# Patient Record
Sex: Female | Born: 1965
Health system: Southern US, Community
[De-identification: ages and names within clinical notes are randomized; demographics above are authoritative.]

## PROBLEM LIST (undated history)

## (undated) DIAGNOSIS — I10 Essential (primary) hypertension: Secondary | ICD-10-CM

## (undated) DIAGNOSIS — J45909 Unspecified asthma, uncomplicated: Secondary | ICD-10-CM

## (undated) DIAGNOSIS — M1990 Unspecified osteoarthritis, unspecified site: Secondary | ICD-10-CM

## (undated) DIAGNOSIS — E119 Type 2 diabetes mellitus without complications: Secondary | ICD-10-CM

## (undated) DIAGNOSIS — G629 Polyneuropathy, unspecified: Secondary | ICD-10-CM

## (undated) HISTORY — PX: CARPAL TUNNEL RELEASE: SHX101

## (undated) HISTORY — PX: REPLACEMENT TOTAL KNEE: SUR1224

## (undated) HISTORY — PX: EYE SURGERY: SHX253

## (undated) HISTORY — PX: JOINT REPLACEMENT: SHX530

---

## 2008-12-24 ENCOUNTER — Encounter: Payer: Self-pay | Admitting: Infectious Diseases

## 2009-01-14 ENCOUNTER — Ambulatory Visit: Payer: Self-pay | Admitting: Infectious Diseases

## 2009-01-14 DIAGNOSIS — M1990 Unspecified osteoarthritis, unspecified site: Secondary | ICD-10-CM | POA: Insufficient documentation

## 2009-01-14 DIAGNOSIS — G5600 Carpal tunnel syndrome, unspecified upper limb: Secondary | ICD-10-CM | POA: Insufficient documentation

## 2009-01-14 DIAGNOSIS — A539 Syphilis, unspecified: Secondary | ICD-10-CM | POA: Insufficient documentation

## 2009-01-14 DIAGNOSIS — E669 Obesity, unspecified: Secondary | ICD-10-CM | POA: Insufficient documentation

## 2009-01-14 DIAGNOSIS — I10 Essential (primary) hypertension: Secondary | ICD-10-CM | POA: Insufficient documentation

## 2009-08-20 ENCOUNTER — Encounter: Admission: RE | Admit: 2009-08-20 | Discharge: 2009-09-12 | Payer: Self-pay | Admitting: Internal Medicine

## 2010-03-25 ENCOUNTER — Ambulatory Visit: Payer: Self-pay | Admitting: Diagnostic Radiology

## 2010-03-25 ENCOUNTER — Ambulatory Visit (HOSPITAL_BASED_OUTPATIENT_CLINIC_OR_DEPARTMENT_OTHER): Admission: RE | Admit: 2010-03-25 | Discharge: 2010-03-25 | Payer: Self-pay | Admitting: Internal Medicine

## 2010-04-15 ENCOUNTER — Encounter
Admission: RE | Admit: 2010-04-15 | Discharge: 2010-06-10 | Payer: Self-pay | Source: Home / Self Care | Admitting: Sports Medicine

## 2010-06-19 ENCOUNTER — Ambulatory Visit (HOSPITAL_COMMUNITY): Admission: RE | Admit: 2010-06-19 | Discharge: 2010-06-20 | Payer: Self-pay | Admitting: Orthopedic Surgery

## 2010-07-21 ENCOUNTER — Encounter
Admission: RE | Admit: 2010-07-21 | Discharge: 2010-07-29 | Payer: Self-pay | Source: Home / Self Care | Attending: Orthopedic Surgery | Admitting: Orthopedic Surgery

## 2010-08-03 ENCOUNTER — Encounter: Admission: RE | Admit: 2010-08-03 | Payer: Self-pay | Source: Home / Self Care | Admitting: Orthopedic Surgery

## 2010-08-05 ENCOUNTER — Encounter
Admission: RE | Admit: 2010-08-05 | Discharge: 2010-08-05 | Payer: Self-pay | Source: Home / Self Care | Attending: Physical Medicine and Rehabilitation | Admitting: Physical Medicine and Rehabilitation

## 2010-08-12 ENCOUNTER — Encounter: Admit: 2010-08-12 | Payer: Self-pay | Admitting: Orthopedic Surgery

## 2010-10-13 LAB — DIFFERENTIAL
Basophils Absolute: 0 10*3/uL (ref 0.0–0.1)
Basophils Relative: 0 % (ref 0–1)
Monocytes Relative: 5 % (ref 3–12)
Neutro Abs: 5.3 10*3/uL (ref 1.7–7.7)
Neutrophils Relative %: 66 % (ref 43–77)

## 2010-10-13 LAB — PROTIME-INR
INR: 0.9 (ref 0.00–1.49)
Prothrombin Time: 12.4 seconds (ref 11.6–15.2)

## 2010-10-13 LAB — APTT: aPTT: 28 seconds (ref 24–37)

## 2010-10-13 LAB — BASIC METABOLIC PANEL
BUN: 12 mg/dL (ref 6–23)
CO2: 33 mEq/L — ABNORMAL HIGH (ref 19–32)
Calcium: 9.1 mg/dL (ref 8.4–10.5)
Chloride: 99 mEq/L (ref 96–112)
Creatinine, Ser: 0.83 mg/dL (ref 0.4–1.2)
GFR calc Af Amer: >60 mL/min (ref >60)
GFR calc non Af Amer: >60 mL/min (ref >60)
Glucose, Bld: 132 mg/dL — ABNORMAL HIGH (ref 70–99)
Sodium: 138 mEq/L (ref 135–145)

## 2010-10-13 LAB — URINALYSIS, ROUTINE W REFLEX MICROSCOPIC
Bilirubin Urine: NEGATIVE
Glucose, UA: NEGATIVE mg/dL
Hgb urine dipstick: NEGATIVE
Ketones, ur: NEGATIVE mg/dL
Protein, ur: NEGATIVE mg/dL

## 2010-10-13 LAB — CBC
Hemoglobin: 11.5 g/dL — ABNORMAL LOW (ref 12.0–15.0)
MCHC: 32.4 g/dL (ref 30.0–36.0)
Platelets: 177 10*3/uL (ref 150–400)

## 2011-03-03 ENCOUNTER — Ambulatory Visit: Payer: Medicaid Other | Attending: Orthopedic Surgery | Admitting: Physical Therapy

## 2011-03-11 ENCOUNTER — Ambulatory Visit: Payer: Medicaid Other | Admitting: Physical Therapy

## 2011-04-28 ENCOUNTER — Ambulatory Visit: Payer: Medicaid Other | Admitting: Physical Therapy

## 2011-04-29 ENCOUNTER — Ambulatory Visit: Payer: Medicaid Other | Admitting: Physical Therapy

## 2011-05-12 ENCOUNTER — Ambulatory Visit: Payer: Medicaid Other | Attending: Orthopedic Surgery | Admitting: Physical Therapy

## 2011-05-12 DIAGNOSIS — M25669 Stiffness of unspecified knee, not elsewhere classified: Secondary | ICD-10-CM | POA: Insufficient documentation

## 2011-05-12 DIAGNOSIS — M25569 Pain in unspecified knee: Secondary | ICD-10-CM | POA: Insufficient documentation

## 2011-05-12 DIAGNOSIS — IMO0001 Reserved for inherently not codable concepts without codable children: Secondary | ICD-10-CM | POA: Insufficient documentation

## 2011-05-19 ENCOUNTER — Ambulatory Visit: Payer: Medicaid Other | Admitting: Physical Therapy

## 2011-05-24 ENCOUNTER — Ambulatory Visit: Payer: Medicaid Other | Admitting: Physical Therapy

## 2011-05-26 ENCOUNTER — Encounter: Payer: Medicaid Other | Admitting: Physical Therapy

## 2011-06-02 ENCOUNTER — Ambulatory Visit: Payer: Medicaid Other | Admitting: Physical Therapy

## 2011-06-08 ENCOUNTER — Encounter: Payer: Medicaid Other | Admitting: Physical Therapy

## 2011-06-17 ENCOUNTER — Ambulatory Visit: Payer: Medicaid Other | Attending: Orthopedic Surgery | Admitting: Physical Therapy

## 2011-06-17 DIAGNOSIS — M25669 Stiffness of unspecified knee, not elsewhere classified: Secondary | ICD-10-CM | POA: Insufficient documentation

## 2011-06-17 DIAGNOSIS — M25569 Pain in unspecified knee: Secondary | ICD-10-CM | POA: Insufficient documentation

## 2011-06-17 DIAGNOSIS — IMO0001 Reserved for inherently not codable concepts without codable children: Secondary | ICD-10-CM | POA: Insufficient documentation

## 2011-08-13 DIAGNOSIS — T85398A Other mechanical complication of other ocular prosthetic devices, implants and grafts, initial encounter: Secondary | ICD-10-CM | POA: Insufficient documentation

## 2011-09-06 ENCOUNTER — Ambulatory Visit: Payer: Medicare Other | Attending: Anesthesiology | Admitting: Physical Therapy

## 2011-09-06 DIAGNOSIS — M545 Low back pain, unspecified: Secondary | ICD-10-CM | POA: Insufficient documentation

## 2011-09-06 DIAGNOSIS — M25569 Pain in unspecified knee: Secondary | ICD-10-CM | POA: Insufficient documentation

## 2011-09-06 DIAGNOSIS — IMO0001 Reserved for inherently not codable concepts without codable children: Secondary | ICD-10-CM | POA: Insufficient documentation

## 2013-01-06 DIAGNOSIS — F4310 Post-traumatic stress disorder, unspecified: Secondary | ICD-10-CM | POA: Insufficient documentation

## 2013-01-06 DIAGNOSIS — F339 Major depressive disorder, recurrent, unspecified: Secondary | ICD-10-CM | POA: Insufficient documentation

## 2013-01-06 DIAGNOSIS — E7800 Pure hypercholesterolemia, unspecified: Secondary | ICD-10-CM | POA: Insufficient documentation

## 2014-04-03 DIAGNOSIS — G4733 Obstructive sleep apnea (adult) (pediatric): Secondary | ICD-10-CM | POA: Insufficient documentation

## 2014-04-03 DIAGNOSIS — E1149 Type 2 diabetes mellitus with other diabetic neurological complication: Secondary | ICD-10-CM | POA: Insufficient documentation

## 2014-04-03 DIAGNOSIS — M5416 Radiculopathy, lumbar region: Secondary | ICD-10-CM | POA: Insufficient documentation

## 2014-04-03 DIAGNOSIS — K219 Gastro-esophageal reflux disease without esophagitis: Secondary | ICD-10-CM | POA: Insufficient documentation

## 2014-04-03 DIAGNOSIS — J309 Allergic rhinitis, unspecified: Secondary | ICD-10-CM | POA: Insufficient documentation

## 2014-04-03 DIAGNOSIS — E1142 Type 2 diabetes mellitus with diabetic polyneuropathy: Secondary | ICD-10-CM | POA: Insufficient documentation

## 2014-04-03 DIAGNOSIS — J4520 Mild intermittent asthma, uncomplicated: Secondary | ICD-10-CM | POA: Insufficient documentation

## 2014-04-03 DIAGNOSIS — F1421 Cocaine dependence, in remission: Secondary | ICD-10-CM | POA: Insufficient documentation

## 2014-04-03 DIAGNOSIS — F419 Anxiety disorder, unspecified: Secondary | ICD-10-CM | POA: Insufficient documentation

## 2014-04-03 DIAGNOSIS — G894 Chronic pain syndrome: Secondary | ICD-10-CM | POA: Insufficient documentation

## 2014-04-03 DIAGNOSIS — G43909 Migraine, unspecified, not intractable, without status migrainosus: Secondary | ICD-10-CM | POA: Insufficient documentation

## 2014-04-03 DIAGNOSIS — N3946 Mixed incontinence: Secondary | ICD-10-CM | POA: Insufficient documentation

## 2014-05-30 DIAGNOSIS — F329 Major depressive disorder, single episode, unspecified: Secondary | ICD-10-CM | POA: Insufficient documentation

## 2014-05-30 DIAGNOSIS — F32A Depression, unspecified: Secondary | ICD-10-CM | POA: Insufficient documentation

## 2015-08-26 DIAGNOSIS — F5101 Primary insomnia: Secondary | ICD-10-CM | POA: Insufficient documentation

## 2016-01-30 DIAGNOSIS — E0590 Thyrotoxicosis, unspecified without thyrotoxic crisis or storm: Secondary | ICD-10-CM | POA: Insufficient documentation

## 2017-01-14 DIAGNOSIS — R911 Solitary pulmonary nodule: Secondary | ICD-10-CM | POA: Insufficient documentation

## 2017-02-28 DIAGNOSIS — Z96651 Presence of right artificial knee joint: Secondary | ICD-10-CM | POA: Insufficient documentation

## 2017-11-21 ENCOUNTER — Emergency Department (HOSPITAL_BASED_OUTPATIENT_CLINIC_OR_DEPARTMENT_OTHER): Payer: Medicare Other

## 2017-11-21 ENCOUNTER — Other Ambulatory Visit: Payer: Self-pay

## 2017-11-21 ENCOUNTER — Encounter (HOSPITAL_BASED_OUTPATIENT_CLINIC_OR_DEPARTMENT_OTHER): Payer: Self-pay | Admitting: Emergency Medicine

## 2017-11-21 ENCOUNTER — Emergency Department (HOSPITAL_BASED_OUTPATIENT_CLINIC_OR_DEPARTMENT_OTHER)
Admission: EM | Admit: 2017-11-21 | Discharge: 2017-11-21 | Disposition: A | Discharge status: Home / Self Care | Payer: Medicare Other | Attending: Emergency Medicine | Admitting: Emergency Medicine

## 2017-11-21 DIAGNOSIS — Z7984 Long term (current) use of oral hypoglycemic drugs: Secondary | ICD-10-CM | POA: Diagnosis not present

## 2017-11-21 DIAGNOSIS — Z7982 Long term (current) use of aspirin: Secondary | ICD-10-CM | POA: Diagnosis not present

## 2017-11-21 DIAGNOSIS — Y939 Activity, unspecified: Secondary | ICD-10-CM | POA: Diagnosis not present

## 2017-11-21 DIAGNOSIS — Y929 Unspecified place or not applicable: Secondary | ICD-10-CM | POA: Diagnosis not present

## 2017-11-21 DIAGNOSIS — Z96653 Presence of artificial knee joint, bilateral: Secondary | ICD-10-CM | POA: Diagnosis not present

## 2017-11-21 DIAGNOSIS — Y999 Unspecified external cause status: Secondary | ICD-10-CM | POA: Diagnosis not present

## 2017-11-21 DIAGNOSIS — S46911A Strain of unspecified muscle, fascia and tendon at shoulder and upper arm level, right arm, initial encounter: Secondary | ICD-10-CM | POA: Diagnosis not present

## 2017-11-21 DIAGNOSIS — J45909 Unspecified asthma, uncomplicated: Secondary | ICD-10-CM | POA: Insufficient documentation

## 2017-11-21 DIAGNOSIS — E119 Type 2 diabetes mellitus without complications: Secondary | ICD-10-CM | POA: Diagnosis not present

## 2017-11-21 DIAGNOSIS — S4991XA Unspecified injury of right shoulder and upper arm, initial encounter: Secondary | ICD-10-CM | POA: Diagnosis present

## 2017-11-21 DIAGNOSIS — R0789 Other chest pain: Secondary | ICD-10-CM | POA: Insufficient documentation

## 2017-11-21 DIAGNOSIS — I10 Essential (primary) hypertension: Secondary | ICD-10-CM | POA: Diagnosis not present

## 2017-11-21 DIAGNOSIS — X58XXXA Exposure to other specified factors, initial encounter: Secondary | ICD-10-CM | POA: Insufficient documentation

## 2017-11-21 HISTORY — DX: Essential (primary) hypertension: I10

## 2017-11-21 HISTORY — DX: Unspecified asthma, uncomplicated: J45.909

## 2017-11-21 HISTORY — DX: Unspecified osteoarthritis, unspecified site: M19.90

## 2017-11-21 HISTORY — DX: Type 2 diabetes mellitus without complications: E11.9

## 2017-11-21 LAB — TROPONIN I

## 2017-11-21 LAB — CBC
HCT: 36.4 % (ref 36.0–46.0)
Hemoglobin: 12.4 g/dL (ref 12.0–15.0)
MCH: 25.1 pg — AB (ref 26.0–34.0)
MCHC: 34.1 g/dL (ref 30.0–36.0)
MCV: 73.5 fL — AB (ref 78.0–100.0)
PLATELETS: 197 10*3/uL (ref 150–400)
RBC: 4.95 MIL/uL (ref 3.87–5.11)
RDW: 15.6 % — AB (ref 11.5–15.5)
WBC: 5.3 10*3/uL (ref 4.0–10.5)

## 2017-11-21 LAB — BASIC METABOLIC PANEL
Anion gap: 8 (ref 5–15)
BUN: 9 mg/dL (ref 6–20)
CALCIUM: 9.1 mg/dL (ref 8.9–10.3)
CHLORIDE: 105 mmol/L (ref 101–111)
CO2: 23 mmol/L (ref 22–32)
CREATININE: 0.56 mg/dL (ref 0.44–1.00)
GFR calc non Af Amer: >60 mL/min (ref >60)
Glucose, Bld: 89 mg/dL (ref 65–99)
Potassium: 3.3 mmol/L — ABNORMAL LOW (ref 3.5–5.1)
SODIUM: 136 mmol/L (ref 135–145)

## 2017-11-21 MED ORDER — CYCLOBENZAPRINE HCL 10 MG PO TABS: 10.0000 mg | ORAL_TABLET | Freq: Three times a day (TID) | ORAL | 0 refills | Status: DC | PRN

## 2017-11-21 MED ORDER — NAPROXEN 500 MG PO TABS: 500.0000 mg | ORAL_TABLET | Freq: Two times a day (BID) | ORAL | 0 refills | Status: DC

## 2017-11-21 MED ORDER — POTASSIUM CHLORIDE CRYS ER 20 MEQ PO TBCR
40.0000 meq | EXTENDED_RELEASE_TABLET | Freq: Once | ORAL | Status: AC
  Administered 2017-11-21: 40 meq via ORAL
  Filled 2017-11-21: qty 2

## 2017-11-21 MED ORDER — KETOROLAC TROMETHAMINE 15 MG/ML IJ SOLN
15.0000 mg | Freq: Once | INTRAMUSCULAR | Status: AC
  Administered 2017-11-21: 15 mg via INTRAVENOUS
  Filled 2017-11-21: qty 1

## 2017-11-21 NOTE — ED Provider Notes (Signed)
[facility] EMERGENCY DEPARTMENT Provider Note   CSN: [id number] Arrival date & time: 11/21/17  1630     History   Chief Complaint Chief Complaint  Patient presents with  . Chest Pain  . Shoulder Pain    HPI Jeanne Shepard is a 52 y.o. female.  HPI  52 year old female presents with right shoulder pain and right-sided chest pain.  Shoulder pain has been for about 2 weeks.  No trauma.  Hurts worse in her shoulder and shoots down her right arm as well as into her right neck.  It is dull, achy, and burning.  It is constant.  Positional changes of her neck do not cause increased pain.  There is no weakness.  She has not taken anything for the pain.  Over the last 2 or 3 days has been having intermittent right-sided chest pain in her mid axillary line.  No trauma here either.  His pain is more sharp.  It is not pleuritic or worse with exertion.  There is no shortness of breath or diaphoresis.  Past Medical History:  Diagnosis Date  . Arthritis   . Asthma   . Diabetes mellitus without complication (HCC)   . Hypertension     Patient Active Problem List   Diagnosis Date Noted  . SYPHILIS 01/14/2009  . OBESITY 01/14/2009  . CARPAL TUNNEL SYNDROME 01/14/2009  . HYPERTENSION 01/14/2009  . DEGENERATIVE JOINT DISEASE 01/14/2009    Past Surgical History:  Procedure Laterality Date  . CARPAL TUNNEL RELEASE Bilateral   . EYE SURGERY    . REPLACEMENT TOTAL KNEE Bilateral      OB History   None      Home Medications    Prior to Admission medications   Medication Sig Start Date End Date Taking? Authorizing Provider  albuterol (PROAIR HFA) 108 (90 Base) MCG/ACT inhaler Inhale into the lungs.   Yes [provider]  amLODipine (NORVASC) 10 MG tablet TK 1 T PO ONCE DAILY 09/21/17  Yes [provider]  aspirin EC 81 MG tablet Take by mouth. 07/01/17  Yes [provider]  diclofenac (VOLTAREN) 75 MG EC tablet  10/20/17  Yes [provider]   Estradiol 10 MCG TABS vaginal tablet INSERT 1 TABLET INTRAVAGINALLY QHS FOR 2 WEEKS THEN TWICE WEEKLY 11/16/17  Yes [provider]  fluticasone furoate-vilanterol (BREO ELLIPTA) 100-25 MCG/INH AEPB INHALE 1 PUFF INTO THE LUNGS DAILY 12/15/16  Yes [provider]  gabapentin (NEURONTIN) 600 MG tablet  11/17/17  Yes [provider]  lisinopril (PRINIVIL,ZESTRIL) 5 MG tablet TK 1 T PO ONCE DAILY 08/18/17  Yes [provider]  metFORMIN (GLUCOPHAGE-XR) 500 MG 24 hr tablet TK 2 TS PO ATN 10/31/17  Yes [provider]  methimazole (TAPAZOLE) 5 MG tablet  08/27/17  Yes [provider]  omeprazole (PRILOSEC) 20 MG capsule TK 1 C PO BID 08/29/17  Yes [provider]  pravastatin (PRAVACHOL) 40 MG tablet  10/14/17  Yes [provider]  Vitamin D, Ergocalciferol, (DRISDOL) 50000 units CAPS capsule Take by mouth. 03/31/17  Yes [provider]  zolpidem (AMBIEN) 10 MG tablet TK 1 T PO Q NIGHT PRF SLP 10/26/17  Yes [provider]  cyclobenzaprine (FLEXERIL) 10 MG tablet Take 1 tablet (10 mg total) by mouth 3 (three) times daily as needed for muscle spasms. 11/21/17   Pricilla Loveless, MD  naproxen (NAPROSYN) 500 MG tablet Take 1 tablet (500 mg total) by mouth 2 (two) times daily.  11/21/17   Pricilla Loveless, MD    Family History No family history on file.  Social History Social History   Tobacco Use  . Smoking status: Never Smoker  . Smokeless tobacco: Never Used  Substance Use Topics  . Alcohol use: Yes  . Drug use: Not Currently     Allergies   Patient has no known allergies.   Review of Systems Review of Systems  Respiratory: Negative for shortness of breath.   Cardiovascular: Positive for chest pain.  Gastrointestinal: Negative for abdominal pain and vomiting.  Musculoskeletal: Positive for arthralgias, myalgias and neck pain.  Neurological: Negative for weakness.  All other systems reviewed and are  negative.    Physical Exam Updated Vital Signs BP (!) 143/87 (BP Location: Left Arm)   Pulse 67   Temp 98.2 F (36.8 C) (Oral)   Resp 13   Ht 5\' 1"  (1.549 m)   Wt 88.9 kg (196 lb)   SpO2 100%   BMI 37.03 kg/m   Physical Exam  Constitutional: She is oriented to person, place, and time. She appears well-developed and well-nourished.  Non-toxic appearance. She does not appear ill. No distress.  HENT:  Head: Normocephalic and atraumatic.  Right Ear: External ear normal.  Left Ear: External ear normal.  Nose: Nose normal.  Eyes: Right eye exhibits no discharge. Left eye exhibits no discharge.  Cardiovascular: Normal rate, regular rhythm and normal heart sounds.  Pulses:      Radial pulses are 2+ on the right side, and 2+ on the left side.  Pulmonary/Chest: Effort normal and breath sounds normal. She exhibits tenderness.    Abdominal: Soft. There is no tenderness.  Musculoskeletal:       Right shoulder: She exhibits tenderness. She exhibits normal range of motion.       Cervical back: She exhibits tenderness.       Back:       Arms: Significant pain with range of motion of the right shoulder but she is able to fully range her shoulder.  Neurological: She is alert and oriented to person, place, and time.  Skin: Skin is warm and dry.  Nursing note and vitals reviewed.    ED Treatments / Results  Labs (all labs ordered are listed, but only abnormal results are displayed) Labs Reviewed  BASIC METABOLIC PANEL - Abnormal; Notable for the following components:      Result Value   Potassium 3.3 (*)    All other components within normal limits  CBC - Abnormal; Notable for the following components:   MCV 73.5 (*)    MCH 25.1 (*)    RDW 15.6 (*)    All other components within normal limits  TROPONIN I    EKG  EKG Interpretation  Date/Time:  Monday November 21 2017 16:39:20 EDT Ventricular Rate:  75 PR Interval:  126 QRS Duration: 66 QT Interval:  396 QTC  Calculation: 442 R Axis:   36 Text Interpretation:  Normal sinus rhythm minimal depression II, AVF No old tracing to compare Confirmed by Pricilla Loveless [phone]) on 11/21/2017 4:42:09 PM  EKG Interpretation  Date/Time:  Monday November 21 2017 18:14:13 EDT Ventricular Rate:  64 PR Interval:  126 QRS Duration: 79 QT Interval:  406 QTC Calculation: 419 R Axis:   42 Text Interpretation:  Sinus rhythm Abnormal R-wave progression, early transition subtle ST depression stable and unchanged from prior Confirmed by Pricilla Loveless [phone]) on 11/21/2017 6:36:05 PM   Radiology Dg Chest 2 View  Result Date: 11/21/2017 CLINICAL DATA:  Upper chest pain radiating into right upper extremity. EXAM: CHEST - 2 VIEW COMPARISON:  Film earlier today at [facility]. FINDINGS: The heart size and mediastinal contours are within normal limits. There is no evidence of pulmonary edema, consolidation, pneumothorax, nodule or pleural fluid. The visualized skeletal structures are unremarkable. IMPRESSION: No active cardiopulmonary disease. Electronically Signed   By: Irish Lack M.D.   On: 11/21/2017 18:34    Procedures Procedures (including critical care time)  Medications Ordered in ED Medications  ketorolac (TORADOL) 15 MG/ML injection 15 mg (15 mg Intravenous Given 11/21/17 1821)  potassium chloride SA (K-DUR,KLOR-CON) CR tablet 40 mEq (40 mEq Oral Given 11/21/17 1838)     Initial Impression / Assessment and Plan / ED Course  I have reviewed the triage vital signs and the nursing notes.  Pertinent labs & imaging results that were available during my care of the patient were reviewed by me and considered in my medical decision making (see chart for details).     Patient's shoulder pain is likely muscular and involves her trapezius and right lateral neck.  She was given IV Toradol with good relief.  I think she can be started on NSAIDs and muscle relaxer for this.  Advised heat as well.  As for her  chest pain this appears to be chest wall in etiology given reproducible chest pain and is near her shoulder pain.  I highly doubt ACS, PE, dissection.  I do not think further workup needed.  She went to [facility] earlier in the day and had initial labs and chest x-ray which were negative.  Chest x-ray repeated here prior to me seeing her and is also negative.  ECG shows subtle possible depressions but it is unchanging and given low concern for ACS I do not think this needs further workup at this time.  Follow-up with PCP.  Return precautions.  Final Clinical Impressions(s) / ED Diagnoses   Final diagnoses:  Muscle strain of right shoulder region, initial encounter  Chest wall pain    ED Discharge Orders        Ordered    naproxen (NAPROSYN) 500 MG tablet  2 times daily     11/21/17 1847    cyclobenzaprine (FLEXERIL) 10 MG tablet  3 times daily PRN     11/21/17 1847       Pricilla Loveless, MD 11/21/17 1610

## 2017-11-21 NOTE — ED Triage Notes (Signed)
Pt reports chest pain and right shoulder pain with tingling that radiates down to the right hand. She states she has been at [facility] hospital since 11 am where she's had xray's and blood work completed. She denies injury to the extremity. NAD at triage.

## 2017-11-21 NOTE — ED Notes (Signed)
States she left [facility] due to long wait after having xrays and lab work.

## 2017-11-21 NOTE — ED Notes (Signed)
ED Provider at bedside. 

## 2017-11-21 NOTE — ED Notes (Signed)
Patient transported to X-ray 

## 2017-11-21 NOTE — ED Notes (Signed)
ED Provider at bedside discussing test results and dispo plan of care. 

## 2017-11-28 ENCOUNTER — Emergency Department (HOSPITAL_BASED_OUTPATIENT_CLINIC_OR_DEPARTMENT_OTHER): Payer: Medicare Other

## 2017-11-28 ENCOUNTER — Other Ambulatory Visit: Payer: Self-pay

## 2017-11-28 ENCOUNTER — Encounter (HOSPITAL_BASED_OUTPATIENT_CLINIC_OR_DEPARTMENT_OTHER): Payer: Self-pay | Admitting: Emergency Medicine

## 2017-11-28 ENCOUNTER — Emergency Department (HOSPITAL_BASED_OUTPATIENT_CLINIC_OR_DEPARTMENT_OTHER)
Admission: EM | Admit: 2017-11-28 | Discharge: 2017-11-28 | Disposition: A | Discharge status: Home / Self Care | Payer: Medicare Other | Attending: Emergency Medicine | Admitting: Emergency Medicine

## 2017-11-28 DIAGNOSIS — Z7982 Long term (current) use of aspirin: Secondary | ICD-10-CM | POA: Insufficient documentation

## 2017-11-28 DIAGNOSIS — Z96653 Presence of artificial knee joint, bilateral: Secondary | ICD-10-CM | POA: Insufficient documentation

## 2017-11-28 DIAGNOSIS — I10 Essential (primary) hypertension: Secondary | ICD-10-CM | POA: Insufficient documentation

## 2017-11-28 DIAGNOSIS — R202 Paresthesia of skin: Secondary | ICD-10-CM | POA: Diagnosis not present

## 2017-11-28 DIAGNOSIS — J45909 Unspecified asthma, uncomplicated: Secondary | ICD-10-CM | POA: Insufficient documentation

## 2017-11-28 DIAGNOSIS — E119 Type 2 diabetes mellitus without complications: Secondary | ICD-10-CM | POA: Insufficient documentation

## 2017-11-28 DIAGNOSIS — Z7984 Long term (current) use of oral hypoglycemic drugs: Secondary | ICD-10-CM | POA: Insufficient documentation

## 2017-11-28 DIAGNOSIS — Z79899 Other long term (current) drug therapy: Secondary | ICD-10-CM | POA: Diagnosis not present

## 2017-11-28 DIAGNOSIS — M25511 Pain in right shoulder: Secondary | ICD-10-CM | POA: Insufficient documentation

## 2017-11-28 MED ORDER — PREDNISONE 20 MG PO TABS: 40.0000 mg | ORAL_TABLET | Freq: Every day | ORAL | 0 refills | Status: AC

## 2017-11-28 MED ORDER — METHOCARBAMOL 500 MG PO TABS: 500.0000 mg | ORAL_TABLET | Freq: Three times a day (TID) | ORAL | 0 refills | Status: DC | PRN

## 2017-11-28 MED ORDER — TIZANIDINE HCL 2 MG PO TABS: 2.0000 mg | ORAL_TABLET | Freq: Three times a day (TID) | ORAL | 0 refills | Status: DC | PRN

## 2017-11-28 MED FILL — tiZANidine HCL 2 MG TABS: 2 | 5 days supply | Qty: 15 | Fill #0

## 2017-11-28 MED FILL — predniSONE 20 MG TABS: 20 | 5 days supply | Qty: 10 | Fill #0

## 2017-11-28 NOTE — ED Provider Notes (Signed)
[facility] EMERGENCY DEPARTMENT Provider Note   CSN: [id number] Arrival date & time: 11/28/17  7829     History   Chief Complaint Chief Complaint  Patient presents with  . Shoulder Pain    HPI Jeanne Shepard is a 52 y.o. female.  HPI Patient is a 52 year old female presents to the emergency department for approximately 3 weeks of ongoing right shoulder pain.  She states her pain is worse with range of motion of her right shoulder.  She was seen in the emergency department 1 week ago and placed on anti-inflammatories and muscle relaxants without improvement.  She states the muscle relaxants are making her feel abnormal though she only took one dose.  No injury or trauma.  No fevers or chills.  She reports some mild paresthesias down into her right upper extremity but has no swelling of her right upper extremity as compared to her left.  She denies neck pain.  No weakness of her right hand.  No prior history of right shoulder injury.  No recent injury or trauma.  Pain is moderate in severity.  No other complaints at this time.   Past Medical History:  Diagnosis Date  . Arthritis   . Asthma   . Diabetes mellitus without complication (HCC)   . Hypertension     Patient Active Problem List   Diagnosis Date Noted  . SYPHILIS 01/14/2009  . OBESITY 01/14/2009  . CARPAL TUNNEL SYNDROME 01/14/2009  . HYPERTENSION 01/14/2009  . DEGENERATIVE JOINT DISEASE 01/14/2009    Past Surgical History:  Procedure Laterality Date  . CARPAL TUNNEL RELEASE Bilateral   . EYE SURGERY    . REPLACEMENT TOTAL KNEE Bilateral      OB History   None      Home Medications    Prior to Admission medications   Medication Sig Start Date End Date Taking? Authorizing Provider  albuterol (PROAIR HFA) 108 (90 Base) MCG/ACT inhaler Inhale into the lungs.    [provider]  amLODipine (NORVASC) 10 MG tablet TK 1 T PO ONCE DAILY 09/21/17   [provider]  aspirin EC 81 MG  tablet Take by mouth. 07/01/17   [provider]  cyclobenzaprine (FLEXERIL) 10 MG tablet Take 1 tablet (10 mg total) by mouth 3 (three) times daily as needed for muscle spasms. 11/21/17   Pricilla Loveless, MD  diclofenac (VOLTAREN) 75 MG EC tablet  10/20/17   [provider]  Estradiol 10 MCG TABS vaginal tablet INSERT 1 TABLET INTRAVAGINALLY QHS FOR 2 WEEKS THEN TWICE WEEKLY 11/16/17   [provider]  fluticasone furoate-vilanterol (BREO ELLIPTA) 100-25 MCG/INH AEPB INHALE 1 PUFF INTO THE LUNGS DAILY 12/15/16   [provider]  gabapentin (NEURONTIN) 600 MG tablet  11/17/17   [provider]  lisinopril (PRINIVIL,ZESTRIL) 5 MG tablet TK 1 T PO ONCE DAILY 08/18/17   [provider]  metFORMIN (GLUCOPHAGE-XR) 500 MG 24 hr tablet TK 2 TS PO ATN 10/31/17   [provider]  methimazole (TAPAZOLE) 5 MG tablet  08/27/17   [provider]  naproxen (NAPROSYN) 500 MG tablet Take 1 tablet (500 mg total) by mouth 2 (two) times daily. 11/21/17   Pricilla Loveless, MD  omeprazole (PRILOSEC) 20 MG capsule TK 1 C PO BID 08/29/17   [provider]  pravastatin (PRAVACHOL) 40 MG tablet  10/14/17   [provider]  Vitamin D, Ergocalciferol, (DRISDOL) 50000 units CAPS capsule Take by mouth. 03/31/17   [provider]  zolpidem (AMBIEN) 10 MG tablet TK 1 T PO Q NIGHT PRF SLP 10/26/17   [provider]    Family History History reviewed. No pertinent family history.  Social History Social History   Tobacco Use  . Smoking status: Never Smoker  . Smokeless tobacco: Never Used  Substance Use Topics  . Alcohol use: Yes  . Drug use: Not Currently     Allergies   Tramadol and Vicodin [hydrocodone-acetaminophen]   Review of Systems Review of Systems  All other systems reviewed and are negative.    Physical Exam Updated Vital Signs BP 126/87   Pulse 90   Temp 97.9 F (36.6 C) (Oral)   Resp 16   Ht 5\' 1"   (1.549 m)   Wt 88.9 kg (196 lb)   SpO2 99%   BMI 37.03 kg/m   Physical Exam  Constitutional: She is oriented to person, place, and time. She appears well-developed and well-nourished.  HENT:  Head: Normocephalic.  Eyes: EOM are normal.  Neck: Normal range of motion. Neck supple.  No C-spine tenderness.  Pulmonary/Chest: Effort normal.  Abdominal: She exhibits no distension.  Musculoskeletal: Normal range of motion.  Mild painful range of motion the right shoulder.  She is limited past 90 degrees and abduction secondary to pain.  She has a normal grip strength of the right hand.  Normal right radial pulse.  No swelling of the right upper extremity as compared to the left.  No rash noted.  Neurological: She is alert and oriented to person, place, and time.  Psychiatric: She has a normal mood and affect.  Nursing note and vitals reviewed.    ED Treatments / Results  Labs (all labs ordered are listed, but only abnormal results are displayed) Labs Reviewed - No data to display  EKG None  Radiology Dg Shoulder Right  Result Date: 11/28/2017 CLINICAL DATA:  Right shoulder pain for the past 3 weeks. No known injury. EXAM: RIGHT SHOULDER - 2+ VIEW COMPARISON:  Report of a right shoulder series of November 07, 2014 FINDINGS: Frontal, maxillary, and transscapular Y-view radiographs are reviewed. The bones are subjectively adequately mineralized. The joint spaces are well maintained. The soft tissues are unremarkable. IMPRESSION: There is no acute or significant chronic bony abnormality of the right shoulder. Electronically Signed   By: David  [place] M.D.   On: 11/28/2017 08:11    Procedures Procedures (including critical care time)  Medications Ordered in ED Medications - No data to display   Initial Impression / Assessment and Plan / ED Course  I have reviewed the triage vital signs and the nursing notes.  Pertinent labs & imaging results that were available during my care of the  patient were reviewed by me and considered in my medical decision making (see chart for details).     May represent ligamentous injury/partial tear versus bursitis.  Doubt septic arthritis.  No signs to suggest DVT.  Normal arterial flow into the right upper extremity.  May represent developing cervical radiculopathy.  No indication for emergent imaging of her cervical spine with MRI.  Outpatient primary care and sports medicine follow-up.  Home with a course of steroids anti-inflammatories and a different muscle relaxant.  Patient understands return to the ER for new or worsening symptoms  Sling for comfort  Final Clinical Impressions(s) / ED Diagnoses   Final diagnoses:  None    ED Discharge Orders    None       Azalia Bilis, MD 11/28/17  0822  

## 2017-11-28 NOTE — ED Triage Notes (Signed)
Patient continues to c/o right arm pain. States that she took naproxen without relief and that she is scared to take the flexeril because it makes her feel anxious.

## 2017-12-05 ENCOUNTER — Encounter: Payer: Self-pay | Admitting: Family Medicine

## 2017-12-05 ENCOUNTER — Ambulatory Visit (INDEPENDENT_AMBULATORY_CARE_PROVIDER_SITE_OTHER): Payer: Medicare Other | Admitting: Family Medicine

## 2017-12-05 DIAGNOSIS — M542 Cervicalgia: Secondary | ICD-10-CM

## 2017-12-05 MED ORDER — OXYCODONE-ACETAMINOPHEN 5-325 MG PO TABS: 1.0000 | ORAL_TABLET | ORAL | 0 refills | Status: DC | PRN

## 2017-12-05 MED ORDER — PREDNISONE 10 MG PO TABS: ORAL_TABLET | ORAL | 0 refills | Status: DC

## 2017-12-05 NOTE — Assessment & Plan Note (Signed)
consistent with cervical radiculopathy.  Unfortunately not improving with 3-4 weeks of symptoms and tried flexeril, tizanidine, prednisone dose pack, with severe pain.  Will go ahead with MRI.  In meantime prednisone with percocet as needed.  Consider physical therapy.  Ergonomic issues reviewed.  Heat.

## 2017-12-05 NOTE — Patient Instructions (Addendum)
You have cervical radiculopathy (a pinched nerve in the neck). Prednisone 12 day dose pack to relieve irritation/inflammation of the nerve. Percocet as needed for severe pain (no driving on this medicine) - we do not refill these medications. Consider cervical collar if severely painful. Simple range of motion exercises within limits of pain to prevent further stiffness. Consider physical therapy for stretching, exercises, traction, and modalities. Heat 15 minutes at a time 3-4 times a day to help with spasms. Watch head position when on computers, texting, when sleeping in bed - should in line with back to prevent further nerve traction and irritation. Consider home traction unit if you get benefit with this in physical therapy. We will go ahead with an MRI since you haven't improved with conservative treatment over the past few weeks.

## 2017-12-05 NOTE — Progress Notes (Addendum)
PCP: [facility]  Subjective:   HPI: Patient is a 52 y.o. female here for right shoulder pain.  Patient reports for about 3-4 weeks now she's had severe pain in right lateral and posterior shoulder radiating down to her right wrist and thumb. Associated numbness in same distribution. Some neck soreness and pain worse with turning to the right. Tried flexeril, tizanidine without much benefit. Prednisone also taken without much benefit. Pain level 7-8/10 and sharp. No skin changes.  Past Medical History:  Diagnosis Date  . Arthritis   . Asthma   . Diabetes mellitus without complication (HCC)   . Hypertension     Current Outpatient Medications on File Prior to Visit  Medication Sig Dispense Refill  . albuterol (PROAIR HFA) 108 (90 Base) MCG/ACT inhaler Inhale into the lungs.    Marland Kitchen amLODipine (NORVASC) 10 MG tablet TK 1 T PO ONCE DAILY  1  . aspirin EC 81 MG tablet Take by mouth.    . diclofenac (VOLTAREN) 75 MG EC tablet   0  . Estradiol 10 MCG TABS vaginal tablet INSERT 1 TABLET INTRAVAGINALLY QHS FOR 2 WEEKS THEN TWICE WEEKLY  0  . fluticasone furoate-vilanterol (BREO ELLIPTA) 100-25 MCG/INH AEPB INHALE 1 PUFF INTO THE LUNGS DAILY    . gabapentin (NEURONTIN) 600 MG tablet     . lisinopril (PRINIVIL,ZESTRIL) 5 MG tablet TK 1 T PO ONCE DAILY  0  . metFORMIN (GLUCOPHAGE-XR) 500 MG 24 hr tablet TK 2 TS PO ATN  0  . methimazole (TAPAZOLE) 5 MG tablet   1  . omeprazole (PRILOSEC) 20 MG capsule TK 1 C PO BID  1  . pravastatin (PRAVACHOL) 40 MG tablet   1  . tiZANidine (ZANAFLEX) 2 MG tablet Take 1 tablet (2 mg total) by mouth every 8 (eight) hours as needed for up to 15 days for muscle spasms. 15 tablet 0  . Vitamin D, Ergocalciferol, (DRISDOL) 50000 units CAPS capsule Take by mouth.    . zolpidem (AMBIEN) 10 MG tablet TK 1 T PO Q NIGHT PRF SLP  2   No current facility-administered medications on file prior to visit.     Past Surgical History:  Procedure  Laterality Date  . CARPAL TUNNEL RELEASE Bilateral   . EYE SURGERY    . REPLACEMENT TOTAL KNEE Bilateral     Allergies  Allergen Reactions  . Tramadol Itching  . Vicodin [Hydrocodone-Acetaminophen] Itching    Social History   Socioeconomic History  . Marital status: Married    Spouse name: Not on file  . Number of children: Not on file  . Years of education: Not on file  . Highest education level: Not on file  Occupational History  . Not on file  Social Needs  . Financial resource strain: Not on file  . Food insecurity:    Worry: Not on file    Inability: Not on file  . Transportation needs:    Medical: Not on file    Non-medical: Not on file  Tobacco Use  . Smoking status: Never Smoker  . Smokeless tobacco: Never Used  Substance and Sexual Activity  . Alcohol use: Yes  . Drug use: Not Currently  . Sexual activity: Not Currently  Lifestyle  . Physical activity:    Days per week: Not on file    Minutes per session: Not on file  . Stress: Not on file  Relationships  . Social connections:    Talks on phone: Not on file  Gets together: Not on file    Attends religious service: Not on file    Active member of club or organization: Not on file    Attends meetings of clubs or organizations: Not on file    Relationship status: Not on file  . Intimate partner violence:    Fear of current or ex partner: Not on file    Emotionally abused: Not on file    Physically abused: Not on file    Forced sexual activity: Not on file  Other Topics Concern  . Not on file  Social History Narrative  . Not on file    History reviewed. No pertinent family history.  BP 124/76   Pulse 85   Ht 5\' 1"  (1.549 m)   Wt 187 lb (84.8 kg)   BMI 35.33 kg/m   Review of Systems: See HPI above.     Objective:  Physical Exam:  Gen: NAD, comfortable in exam room  Neck: No gross deformity, swelling, bruising. No paraspinal tenderness.  No midline/bony TTP. Right lateral rotation  20 degrees, painful.  FROM other motions. BUE strength 5/5.   Sensation diminished radial side of forearm into thumb. 2+ equal reflexes in triceps, biceps, brachioradialis tendons. Positive spurlings on right. 2+ radial pulses.  Right shoulder: No swelling, ecchymoses.  No gross deformity. No TTP. FROM but pain on abduction and flexion. Negative Hawkins, Neers. Negative Yergasons. Strength 4/5 with empty can and 5/5 resisted internal/external rotation.  Pain with empty can NV intact distally.   Assessment & Plan:  1. Right arm pain - consistent with cervical radiculopathy.  Unfortunately not improving with 3-4 weeks of symptoms and tried flexeril, tizanidine, prednisone dose pack, with severe pain.  Will go ahead with MRI.  In meantime prednisone with percocet as needed.  Consider physical therapy.  Ergonomic issues reviewed.  Heat.    Addendum:  MRI reviewed and discussed with patient.  Appears similar to the MRI she had back in September 2018.  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  We discussed options - I don't think she needs to see neurosurgeon for this.  We discussed ESI trial - she does not want to do this.  She will restart physical therapy.  Continue diclofenac with tizanidine, gabapentin.  Consider topical medication also.  F/u in 6 weeks.

## 2017-12-07 NOTE — Addendum Note (Signed)
Addended by: Kathi Simpers F on: 12/07/2017 08:52 AM   Modules accepted: Orders

## 2017-12-08 NOTE — Addendum Note (Signed)
Addended by: Kathi Simpers F on: 12/08/2017 02:25 PM   Modules accepted: Orders

## 2017-12-12 ENCOUNTER — Telehealth: Payer: Self-pay | Admitting: Family Medicine

## 2017-12-12 ENCOUNTER — Other Ambulatory Visit: Payer: Self-pay | Admitting: Family Medicine

## 2017-12-12 MED ORDER — NAPROXEN 500 MG PO TABS: 500.0000 mg | ORAL_TABLET | Freq: Two times a day (BID) | ORAL | 2 refills | Status: DC | PRN

## 2017-12-12 MED ORDER — TIZANIDINE HCL 4 MG PO TABS: 4.0000 mg | ORAL_TABLET | Freq: Three times a day (TID) | ORAL | 1 refills | Status: DC | PRN

## 2017-12-12 NOTE — Telephone Encounter (Signed)
Sent to pharmacy. Thanks

## 2017-12-12 NOTE — Telephone Encounter (Signed)
Patient left a voicemail requesting a refill of Percocet

## 2017-12-12 NOTE — Telephone Encounter (Signed)
Patient requesting a prescription for muscle relaxants

## 2017-12-12 NOTE — Telephone Encounter (Signed)
She was informed at her office visit we do not refill percocet.  If she's done with prednisone we could do a strong anti-inflammatory like voltaren or naproxen.  Can take this with the tizanidine, tylenol, and topical medications.

## 2017-12-12 NOTE — Telephone Encounter (Signed)
Sent tizanidine to the pharmacy on file.  Thanks!

## 2017-12-12 NOTE — Telephone Encounter (Signed)
Patient was informed.

## 2017-12-12 NOTE — Telephone Encounter (Signed)
Patient was informed. She would like a prescription of naproxen

## 2017-12-22 ENCOUNTER — Encounter: Payer: Self-pay | Admitting: Family Medicine

## 2018-01-14 ENCOUNTER — Other Ambulatory Visit: Payer: Self-pay | Admitting: Family Medicine

## 2018-01-17 ENCOUNTER — Telehealth: Payer: Self-pay | Admitting: Family Medicine

## 2018-01-17 ENCOUNTER — Other Ambulatory Visit: Payer: Self-pay | Admitting: Family Medicine

## 2018-01-17 MED ORDER — TIZANIDINE HCL 4 MG PO TABS: 4.0000 mg | ORAL_TABLET | Freq: Four times a day (QID) | ORAL | 1 refills | Status: DC | PRN

## 2018-01-17 NOTE — Telephone Encounter (Signed)
Patient requesting refill of Tizanidine. States she has used extra refill from 5/13 Rx.   Pharmacy: [facility] [address]

## 2018-01-17 NOTE — Telephone Encounter (Signed)
Sent in tizanidine

## 2018-01-17 NOTE — Telephone Encounter (Signed)
Patient was informed.

## 2018-02-12 ENCOUNTER — Other Ambulatory Visit: Payer: Self-pay | Admitting: Family Medicine

## 2018-02-13 ENCOUNTER — Telehealth: Payer: Self-pay | Admitting: Family Medicine

## 2018-02-13 NOTE — Telephone Encounter (Signed)
We need to see her back for an appointment.  It's more likely the underlying problem is causing this, not the tizanidine.  It's been over 2 months since her last appointment.

## 2018-02-13 NOTE — Telephone Encounter (Signed)
Patient left a voicemail regarding Tizanidine Rx. Patient states it is not helping but making her arm hurt worse and it gives her a pins and needles sensation.   Patient asking if another medication can be sent in to help with the pain in her arm

## 2018-02-13 NOTE — Telephone Encounter (Signed)
Patient was informed and a follow up appointment was scheduled

## 2018-02-16 ENCOUNTER — Encounter: Payer: Self-pay | Admitting: Family Medicine

## 2018-02-16 ENCOUNTER — Ambulatory Visit (INDEPENDENT_AMBULATORY_CARE_PROVIDER_SITE_OTHER): Payer: Medicare Other | Admitting: Family Medicine

## 2018-02-16 DIAGNOSIS — M542 Cervicalgia: Secondary | ICD-10-CM

## 2018-02-16 MED ORDER — METHOCARBAMOL 500 MG PO TABS: 500.0000 mg | ORAL_TABLET | Freq: Three times a day (TID) | ORAL | 1 refills | Status: AC | PRN

## 2018-02-16 NOTE — Patient Instructions (Signed)
Continue with the physical therapy and home exercises. Continue diclofenac and gabapentin as you have been. Try robaxin up to three times a day as needed instead of the tizanidine. Follow up with me in 6 weeks.

## 2018-02-17 ENCOUNTER — Encounter: Payer: Self-pay | Admitting: Family Medicine

## 2018-02-17 NOTE — Progress Notes (Signed)
PCP: [facility]  Subjective:   HPI: Patient is a 52 y.o. female here for right shoulder pain.  5/6: Patient reports for about 3-4 weeks now she's had severe pain in right lateral and posterior shoulder radiating down to her right wrist and thumb. Associated numbness in same distribution. Some neck soreness and pain worse with turning to the right. Tried flexeril, tizanidine without much benefit. Prednisone also taken without much benefit. Pain level 7-8/10 and sharp. No skin changes.  7/18: Patient reports she feels a little better. Doing physical therapy and home exercises. Taking diclofenac and gabapentin. Stopped tizanidine about 3 weeks ago - felt like she got pins and needles into right arm every time she took this down to the thumb. Pain 3/10 level. Denies neck pain. No bowel/bladder dysfunction.   Past Medical History:  Diagnosis Date  . Arthritis   . Asthma   . Diabetes mellitus without complication (HCC)   . Hypertension     Current Outpatient Medications on File Prior to Visit  Medication Sig Dispense Refill  . albuterol (PROAIR HFA) 108 (90 Base) MCG/ACT inhaler Inhale into the lungs.    Marland Kitchen amLODipine (NORVASC) 10 MG tablet TK 1 T PO ONCE DAILY  1  . aspirin EC 81 MG tablet Take by mouth.    . diclofenac (VOLTAREN) 75 MG EC tablet   1  . Estradiol 10 MCG TABS vaginal tablet INSERT 1 TABLET INTRAVAGINALLY QHS FOR 2 WEEKS THEN TWICE WEEKLY  0  . fluticasone furoate-vilanterol (BREO ELLIPTA) 100-25 MCG/INH AEPB INHALE 1 PUFF INTO THE LUNGS DAILY    . gabapentin (NEURONTIN) 600 MG tablet     . lisinopril (PRINIVIL,ZESTRIL) 5 MG tablet TK 1 T PO ONCE DAILY  0  . metFORMIN (GLUCOPHAGE-XR) 500 MG 24 hr tablet TK 2 TS PO ATN  0  . methimazole (TAPAZOLE) 5 MG tablet   1  . omeprazole (PRILOSEC) 20 MG capsule TK 1 C PO BID  1  . pravastatin (PRAVACHOL) 40 MG tablet   1  . tiZANidine (ZANAFLEX) 4 MG tablet Take 1 tablet (4 mg total) by mouth every 6  (six) hours as needed. 60 tablet 1  . Vitamin D, Ergocalciferol, (DRISDOL) 50000 units CAPS capsule Take by mouth.    . zolpidem (AMBIEN) 10 MG tablet TK 1 T PO Q NIGHT PRF SLP  2   No current facility-administered medications on file prior to visit.     Past Surgical History:  Procedure Laterality Date  . CARPAL TUNNEL RELEASE Bilateral   . EYE SURGERY    . REPLACEMENT TOTAL KNEE Bilateral     Allergies  Allergen Reactions  . Tramadol Itching  . Vicodin [Hydrocodone-Acetaminophen] Itching    Social History   Socioeconomic History  . Marital status: Married    Spouse name: Not on file  . Number of children: Not on file  . Years of education: Not on file  . Highest education level: Not on file  Occupational History  . Not on file  Social Needs  . Financial resource strain: Not on file  . Food insecurity:    Worry: Not on file    Inability: Not on file  . Transportation needs:    Medical: Not on file    Non-medical: Not on file  Tobacco Use  . Smoking status: Never Smoker  . Smokeless tobacco: Never Used  Substance and Sexual Activity  . Alcohol use: Yes  . Drug use: Not Currently  . Sexual  activity: Not Currently  Lifestyle  . Physical activity:    Days per week: Not on file    Minutes per session: Not on file  . Stress: Not on file  Relationships  . Social connections:    Talks on phone: Not on file    Gets together: Not on file    Attends religious service: Not on file    Active member of club or organization: Not on file    Attends meetings of clubs or organizations: Not on file    Relationship status: Not on file  . Intimate partner violence:    Fear of current or ex partner: Not on file    Emotionally abused: Not on file    Physically abused: Not on file    Forced sexual activity: Not on file  Other Topics Concern  . Not on file  Social History Narrative  . Not on file    History reviewed. No pertinent family history.  BP 137/83   Pulse 83    Ht 5\' 1"  (1.549 m)   Wt 202 lb (91.6 kg)   BMI 38.17 kg/m   Review of Systems: See HPI above.     Objective:  Physical Exam:  Gen: NAD, comfortable in exam room  Neck: No gross deformity, swelling, bruising. No TTP .  No midline/bony TTP. FROM without pain now. BUE strength 5/5.   Sensation intact to light touch currently  2+ equal reflexes in triceps, biceps, brachioradialis tendons. Negative spurlings. NV intact distal BUEs.   Assessment & Plan:  1. Right arm pain - her neck exam is improved compared to last visit.  Consistent with improving cervical radiculopathy vs brachial neuritis.  Continue with physical therapy and home exercises.  Diclofenac and gabapentin.  She has stopped tizanidine - though has had pins and needles sensation other times she has felt medicine is causing this also.  Rx robaxin as needed.  Heat for spasms.  F/u in 6 weeks.

## 2018-02-17 NOTE — Assessment & Plan Note (Signed)
her neck exam is improved compared to last visit.  Consistent with improving cervical radiculopathy vs brachial neuritis.  Continue with physical therapy and home exercises.  Diclofenac and gabapentin.  She has stopped tizanidine - though has had pins and needles sensation other times she has felt medicine is causing this also.  Rx robaxin as needed.  Heat for spasms.  F/u in 6 weeks.

## 2018-03-09 ENCOUNTER — Other Ambulatory Visit: Payer: Self-pay | Admitting: Family Medicine

## 2018-03-09 ENCOUNTER — Ambulatory Visit: Payer: Medicare Other | Admitting: Family Medicine

## 2018-03-16 ENCOUNTER — Ambulatory Visit (INDEPENDENT_AMBULATORY_CARE_PROVIDER_SITE_OTHER): Payer: Medicare Other | Admitting: Family Medicine

## 2018-03-16 ENCOUNTER — Encounter: Payer: Self-pay | Admitting: Family Medicine

## 2018-03-16 VITALS — BP 128/83 | HR 77 | Ht 61.0 in | Wt 206.0 lb

## 2018-03-16 DIAGNOSIS — M79601 Pain in right arm: Secondary | ICD-10-CM | POA: Diagnosis not present

## 2018-03-16 DIAGNOSIS — M542 Cervicalgia: Secondary | ICD-10-CM | POA: Diagnosis not present

## 2018-03-16 NOTE — Progress Notes (Signed)
PCP: Ranae Pila, FNP  Subjective:   HPI: Patient is a 52 y.o. female here for right shoulder pain.  5/6: Patient reports for about 3-4 weeks now she's had severe pain in right lateral and posterior shoulder radiating down to her right wrist and thumb. Associated numbness in same distribution. Some neck soreness and pain worse with turning to the right. Tried flexeril, tizanidine without much benefit. Prednisone also taken without much benefit. Pain level 7-8/10 and sharp. No skin changes.  7/18: Patient reports she feels a little better. Doing physical therapy and home exercises. Taking diclofenac and gabapentin. Stopped tizanidine about 3 weeks ago - felt like she got pins and needles into right arm every time she took this down to the thumb. Pain 3/10 level. Denies neck pain. No bowel/bladder dysfunction.  8/15: Patient reports her pain is worse than last visit. Pain is 7/10 and sharp on right side of neck, posterior shoulder and radiating into her right arm down hand to thumb and index finger. Associated pins/needles sensation. She's taking diclofenac twice a day, tizanidine if needed. Also taking gabapentin - reports 600mg  qam, at lunch, and 1800mg  at bedtime. No new injuries. Doing home exercises; done with physical therapy. No skin changes.  Past Medical History:  Diagnosis Date  . Arthritis   . Asthma   . Diabetes mellitus without complication (HCC)   . Hypertension     Current Outpatient Medications on File Prior to Visit  Medication Sig Dispense Refill  . albuterol (PROAIR HFA) 108 (90 Base) MCG/ACT inhaler Inhale into the lungs.    Marland Kitchen amLODipine (NORVASC) 10 MG tablet TK 1 T PO ONCE DAILY  1  . aspirin EC 81 MG tablet Take by mouth.    . diclofenac (VOLTAREN) 75 MG EC tablet   1  . Estradiol 10 MCG TABS vaginal tablet INSERT 1 TABLET INTRAVAGINALLY QHS FOR 2 WEEKS THEN TWICE WEEKLY  0  . fluticasone furoate-vilanterol (BREO ELLIPTA) 100-25 MCG/INH AEPB INHALE  1 PUFF INTO THE LUNGS DAILY    . gabapentin (NEURONTIN) 600 MG tablet     . lisinopril (PRINIVIL,ZESTRIL) 5 MG tablet TK 1 T PO ONCE DAILY  0  . metFORMIN (GLUCOPHAGE-XR) 500 MG 24 hr tablet TK 2 TS PO ATN  0  . methimazole (TAPAZOLE) 5 MG tablet   1  . methocarbamol (ROBAXIN) 500 MG tablet Take 1 tablet (500 mg total) by mouth every 8 (eight) hours as needed. 60 tablet 1  . omeprazole (PRILOSEC) 20 MG capsule TK 1 C PO BID  1  . pravastatin (PRAVACHOL) 40 MG tablet   1  . tiZANidine (ZANAFLEX) 4 MG tablet Take 1 tablet (4 mg total) by mouth every 6 (six) hours as needed. 60 tablet 1  . Vitamin D, Ergocalciferol, (DRISDOL) 50000 units CAPS capsule Take by mouth.    . zolpidem (AMBIEN) 10 MG tablet TK 1 T PO Q NIGHT PRF SLP  2   No current facility-administered medications on file prior to visit.     Past Surgical History:  Procedure Laterality Date  . CARPAL TUNNEL RELEASE Bilateral   . EYE SURGERY    . REPLACEMENT TOTAL KNEE Bilateral     Allergies  Allergen Reactions  . Tramadol Itching  . Vicodin [Hydrocodone-Acetaminophen] Itching    Social History   Socioeconomic History  . Marital status: Married    Spouse name: Not on file  . Number of children: Not on file  . Years of education: Not on file  .  Highest education level: Not on file  Occupational History  . Not on file  Social Needs  . Financial resource strain: Not on file  . Food insecurity:    Worry: Not on file    Inability: Not on file  . Transportation needs:    Medical: Not on file    Non-medical: Not on file  Tobacco Use  . Smoking status: Never Smoker  . Smokeless tobacco: Never Used  Substance and Sexual Activity  . Alcohol use: Yes  . Drug use: Not Currently  . Sexual activity: Not Currently  Lifestyle  . Physical activity:    Days per week: Not on file    Minutes per session: Not on file  . Stress: Not on file  Relationships  . Social connections:    Talks on phone: Not on file    Gets  together: Not on file    Attends religious service: Not on file    Active member of club or organization: Not on file    Attends meetings of clubs or organizations: Not on file    Relationship status: Not on file  . Intimate partner violence:    Fear of current or ex partner: Not on file    Emotionally abused: Not on file    Physically abused: Not on file    Forced sexual activity: Not on file  Other Topics Concern  . Not on file  Social History Narrative  . Not on file    History reviewed. No pertinent family history.  BP 128/83   Pulse 77   Ht 5\' 1"  (1.549 m)   Wt 206 lb (93.4 kg)   BMI 38.92 kg/m   Review of Systems: See HPI above.     Objective:  Physical Exam:  Gen: NAD, comfortable in exam room  Neck: No gross deformity, swelling, bruising. TTP right trapezius, deltoid, lateral shoulder.  No midline/bony TTP. FROM with pain on right lateral rotation that causes pain into right arm. BUE strength 5/5.   Sensation diminished right index and thumb. 2+ equal reflexes in triceps, biceps, brachioradialis tendons. Positive spurlings on right. 2+ radial pulses.  Right shoulder: No swelling, ecchymoses.  No gross deformity. TTP noted above. FROM. Negative Hawkins, Neers. Strength 5/5 with empty can and resisted internal/external rotation.  Pain empty can and ER. NV intact distally.   Assessment & Plan:  1. Right arm pain -concerning for cervical radiculopathy versus brachial neuritis.  Given her MRI did not show a clear source of right-sided cervical radiculopathy just continue to struggle with pain despite prednisone, physical therapy, diclofenac, gabapentin, tizanidine we will go ahead with nerve conduction studies and EMGs of the right upper extremity.  She will follow-up after this to discuss next steps.  We discussed continuing her gabapentin but considering changing this to 1200 mg 3 times a day.  Continue diclofenac and tizanidine.

## 2018-03-16 NOTE — Patient Instructions (Signed)
Consider changing the gabapentin to 2 tabs (of 600mg  pills) three times a day but discuss with your family doctor first. We will set you up for nerve conduction studies of your right arm. Consider epidural steroid injection. Continue your diclofenac, tizanidine, home exercises in the meantime. Follow up will depend on the nerve conduction studies.

## 2018-03-20 ENCOUNTER — Other Ambulatory Visit: Payer: Self-pay | Admitting: Family Medicine

## 2018-03-21 ENCOUNTER — Telehealth: Payer: Self-pay | Admitting: Family Medicine

## 2018-03-21 NOTE — Telephone Encounter (Signed)
Patient spoke to [facility] regarding nerve conduction study and patient has decided it does not sound like something she is interested in. Instead patient would like to go ahead with an ESI

## 2018-03-22 NOTE — Telephone Encounter (Signed)
Ok to go ahead with trial ESI for possible right C7 radiculopathy.  Thanks!  I'd ask her to call us a week after this to let us know how she's doing.

## 2018-03-22 NOTE — Telephone Encounter (Signed)
Patient called. Informed her that we will be sending an order to [facility] for the [facility] and to contact us a week after the injection

## 2018-03-24 ENCOUNTER — Other Ambulatory Visit: Payer: Self-pay | Admitting: Family Medicine

## 2018-03-24 DIAGNOSIS — M5412 Radiculopathy, cervical region: Secondary | ICD-10-CM

## 2018-03-30 ENCOUNTER — Ambulatory Visit: Payer: Medicare Other | Admitting: Family Medicine

## 2018-04-01 ENCOUNTER — Other Ambulatory Visit: Payer: Self-pay | Admitting: Family Medicine

## 2018-04-10 ENCOUNTER — Ambulatory Visit
Admission: RE | Admit: 2018-04-10 | Discharge: 2018-04-10 | Disposition: A | Discharge status: Home / Self Care | Payer: Medicare Other | Source: Ambulatory Visit | Attending: Family Medicine | Admitting: Family Medicine

## 2018-04-10 DIAGNOSIS — M5412 Radiculopathy, cervical region: Secondary | ICD-10-CM

## 2018-04-10 MED ORDER — IOPAMIDOL (ISOVUE-M 300) INJECTION 61%
1.0000 mL | Freq: Once | INTRAMUSCULAR | Status: AC | PRN
  Administered 2018-04-10: 1 mL via EPIDURAL

## 2018-04-10 MED ORDER — TRIAMCINOLONE ACETONIDE 40 MG/ML IJ SUSP (RADIOLOGY)
60.0000 mg | Freq: Once | INTRAMUSCULAR | Status: AC
  Administered 2018-04-10: 60 mg via EPIDURAL

## 2018-04-10 NOTE — Discharge Instructions (Signed)

## 2018-04-19 ENCOUNTER — Other Ambulatory Visit: Payer: Self-pay | Admitting: Family Medicine

## 2018-04-19 NOTE — Telephone Encounter (Signed)
Patient had ESI on 9/9. States she has started to have pain in her arm again and has scheduled a follow up visit for 9/23

## 2018-04-22 NOTE — Telephone Encounter (Signed)
Noted, thanks!

## 2018-04-24 ENCOUNTER — Ambulatory Visit: Payer: Medicare Other | Admitting: Family Medicine

## 2018-04-28 ENCOUNTER — Ambulatory Visit (INDEPENDENT_AMBULATORY_CARE_PROVIDER_SITE_OTHER): Payer: Medicare Other | Admitting: Family Medicine

## 2018-04-28 ENCOUNTER — Encounter: Payer: Self-pay | Admitting: Family Medicine

## 2018-04-28 VITALS — BP 108/73 | HR 83 | Ht 61.0 in | Wt 204.0 lb

## 2018-04-28 DIAGNOSIS — M79601 Pain in right arm: Secondary | ICD-10-CM | POA: Diagnosis not present

## 2018-04-28 MED ORDER — TIZANIDINE HCL 4 MG PO TABS: 4.0000 mg | ORAL_TABLET | Freq: Three times a day (TID) | ORAL | 2 refills | Status: DC | PRN

## 2018-04-28 NOTE — Patient Instructions (Signed)
We will go ahead with a pain management referral for you. Continue your current medications in the meantime - I've sent a refill in for your tizanidine. Follow up with me as needed.

## 2018-04-30 ENCOUNTER — Encounter: Payer: Self-pay | Admitting: Family Medicine

## 2018-04-30 NOTE — Progress Notes (Signed)
PCP: Ranae Pila, FNP  Subjective:   HPI: Patient is a 52 y.o. female here for right shoulder pain.  5/6: Patient reports for about 3-4 weeks now she's had severe pain in right lateral and posterior shoulder radiating down to her right wrist and thumb. Associated numbness in same distribution. Some neck soreness and pain worse with turning to the right. Tried flexeril, tizanidine without much benefit. Prednisone also taken without much benefit. Pain level 7-8/10 and sharp. No skin changes.  7/18: Patient reports she feels a little better. Doing physical therapy and home exercises. Taking diclofenac and gabapentin. Stopped tizanidine about 3 weeks ago - felt like she got pins and needles into right arm every time she took this down to the thumb. Pain 3/10 level. Denies neck pain. No bowel/bladder dysfunction.  8/15: Patient reports her pain is worse than last visit. Pain is 7/10 and sharp on right side of neck, posterior shoulder and radiating into her right arm down hand to thumb and index finger. Associated pins/needles sensation. She's taking diclofenac twice a day, tizanidine if needed. Also taking gabapentin - reports 600mg  qam, at lunch, and 1800mg  at bedtime. No new injuries. Doing home exercises; done with physical therapy. No skin changes.  9/27: Patient reports she feels the same. Pain level 6/10 and sharp from neck and shoulder down arm into hand. Didn't like the ESI and states it did not help her. She is not interested in NCV/EMGs. Feels her motion is limited in right arm as a result. Taking diclofenac, gabapentin, tizanidine. Doing home exercises. No skin changes. Associated pins/needles. Past Medical History:  Diagnosis Date  . Arthritis   . Asthma   . Diabetes mellitus without complication (HCC)   . Hypertension     Current Outpatient Medications on File Prior to Visit  Medication Sig Dispense Refill  . albuterol (PROAIR HFA) 108 (90 Base) MCG/ACT  inhaler Inhale into the lungs.    Marland Kitchen amLODipine (NORVASC) 10 MG tablet TK 1 T PO ONCE DAILY  1  . aspirin EC 81 MG tablet Take by mouth.    . diclofenac (VOLTAREN) 75 MG EC tablet   1  . Estradiol 10 MCG TABS vaginal tablet INSERT 1 TABLET INTRAVAGINALLY QHS FOR 2 WEEKS THEN TWICE WEEKLY  0  . fluticasone furoate-vilanterol (BREO ELLIPTA) 100-25 MCG/INH AEPB INHALE 1 PUFF INTO THE LUNGS DAILY    . gabapentin (NEURONTIN) 600 MG tablet     . lisinopril (PRINIVIL,ZESTRIL) 5 MG tablet TK 1 T PO ONCE DAILY  0  . metFORMIN (GLUCOPHAGE-XR) 500 MG 24 hr tablet TK 2 TS PO ATN  0  . methimazole (TAPAZOLE) 5 MG tablet   1  . methocarbamol (ROBAXIN) 500 MG tablet Take 1 tablet (500 mg total) by mouth every 8 (eight) hours as needed. 60 tablet 1  . omeprazole (PRILOSEC) 20 MG capsule TK 1 C PO BID  1  . pravastatin (PRAVACHOL) 40 MG tablet   1  . Vitamin D, Ergocalciferol, (DRISDOL) 50000 units CAPS capsule Take by mouth.    . zolpidem (AMBIEN) 10 MG tablet TK 1 T PO Q NIGHT PRF SLP  2   No current facility-administered medications on file prior to visit.     Past Surgical History:  Procedure Laterality Date  . CARPAL TUNNEL RELEASE Bilateral   . EYE SURGERY    . REPLACEMENT TOTAL KNEE Bilateral     Allergies  Allergen Reactions  . Tramadol Itching  . Vicodin [Hydrocodone-Acetaminophen] Itching  Social History   Socioeconomic History  . Marital status: Married    Spouse name: Not on file  . Number of children: Not on file  . Years of education: Not on file  . Highest education level: Not on file  Occupational History  . Not on file  Social Needs  . Financial resource strain: Not on file  . Food insecurity:    Worry: Not on file    Inability: Not on file  . Transportation needs:    Medical: Not on file    Non-medical: Not on file  Tobacco Use  . Smoking status: Never Smoker  . Smokeless tobacco: Never Used  Substance and Sexual Activity  . Alcohol use: Yes  . Drug use: Not  Currently  . Sexual activity: Not Currently  Lifestyle  . Physical activity:    Days per week: Not on file    Minutes per session: Not on file  . Stress: Not on file  Relationships  . Social connections:    Talks on phone: Not on file    Gets together: Not on file    Attends religious service: Not on file    Active member of club or organization: Not on file    Attends meetings of clubs or organizations: Not on file    Relationship status: Not on file  . Intimate partner violence:    Fear of current or ex partner: Not on file    Emotionally abused: Not on file    Physically abused: Not on file    Forced sexual activity: Not on file  Other Topics Concern  . Not on file  Social History Narrative  . Not on file    History reviewed. No pertinent family history.  BP 108/73   Pulse 83   Ht 5\' 1"  (1.549 m)   Wt 204 lb (92.5 kg)   BMI 38.55 kg/m   Review of Systems: See HPI above.     Objective:  Physical Exam:  Gen: NAD, comfortable in exam room  Neck: No gross deformity, swelling, bruising. TTP right trapezius, deltoid, lateral shoulder.  No midline/bony TTP. FROM with pain right lateral rotation. BUE strength 5/5.   Sensation intact to light touch currently 2+ dp pulses.  Right shoulder: No swelling, ecchymoses.  No gross deformity. TTP noted above. FROM but painful. Mild pain Hawkins. Strength 5/5 with empty can and resisted internal/external rotation. NV intact distally.   Assessment & Plan:  1. Right arm pain - Cervical MRI did not show clear source of radiculopathy - trialed ESI for possibility at C4 she had radiculopathy based on MRI but distribution didn't fit and she did not benefit from this.  Still with pain through arm though today some shoulder pain with motions.  Concern for brachial neuritis but she does not want to do NCV/EMGs.  Will refer to pain management.  S/p trials of prednisone, PT, diclofenac, gabapentin, tizanidine as well.  Continue  diclofenac, gabapentin, tizanidine in meantime.

## 2018-06-14 ENCOUNTER — Encounter (HOSPITAL_BASED_OUTPATIENT_CLINIC_OR_DEPARTMENT_OTHER): Payer: Self-pay | Admitting: *Deleted

## 2018-06-14 ENCOUNTER — Emergency Department (HOSPITAL_BASED_OUTPATIENT_CLINIC_OR_DEPARTMENT_OTHER): Payer: Medicare Other

## 2018-06-14 ENCOUNTER — Other Ambulatory Visit: Payer: Self-pay

## 2018-06-14 ENCOUNTER — Emergency Department (HOSPITAL_BASED_OUTPATIENT_CLINIC_OR_DEPARTMENT_OTHER)
Admission: EM | Admit: 2018-06-14 | Discharge: 2018-06-14 | Disposition: A | Discharge status: Home / Self Care | Payer: Medicare Other | Attending: Emergency Medicine | Admitting: Emergency Medicine

## 2018-06-14 DIAGNOSIS — Z7982 Long term (current) use of aspirin: Secondary | ICD-10-CM | POA: Insufficient documentation

## 2018-06-14 DIAGNOSIS — R0789 Other chest pain: Secondary | ICD-10-CM | POA: Insufficient documentation

## 2018-06-14 DIAGNOSIS — Z79899 Other long term (current) drug therapy: Secondary | ICD-10-CM | POA: Insufficient documentation

## 2018-06-14 DIAGNOSIS — J45909 Unspecified asthma, uncomplicated: Secondary | ICD-10-CM | POA: Diagnosis not present

## 2018-06-14 DIAGNOSIS — Z7984 Long term (current) use of oral hypoglycemic drugs: Secondary | ICD-10-CM | POA: Diagnosis not present

## 2018-06-14 DIAGNOSIS — I10 Essential (primary) hypertension: Secondary | ICD-10-CM | POA: Diagnosis not present

## 2018-06-14 DIAGNOSIS — E119 Type 2 diabetes mellitus without complications: Secondary | ICD-10-CM | POA: Insufficient documentation

## 2018-06-14 DIAGNOSIS — R079 Chest pain, unspecified: Secondary | ICD-10-CM | POA: Diagnosis present

## 2018-06-14 LAB — BASIC METABOLIC PANEL
Anion gap: 7 (ref 5–15)
BUN: 11 mg/dL (ref 6–20)
CO2: 27 mmol/L (ref 22–32)
CREATININE: 0.67 mg/dL (ref 0.44–1.00)
Calcium: 9 mg/dL (ref 8.9–10.3)
Chloride: 108 mmol/L (ref 98–111)
GFR calc Af Amer: >60 mL/min (ref >60)
GFR calc non Af Amer: >60 mL/min (ref >60)
GLUCOSE: 132 mg/dL — AB (ref 70–99)
Potassium: 3.2 mmol/L — ABNORMAL LOW (ref 3.5–5.1)
SODIUM: 142 mmol/L (ref 135–145)

## 2018-06-14 LAB — CBC WITH DIFFERENTIAL/PLATELET
Abs Immature Granulocytes: 0.03 10*3/uL (ref 0.00–0.07)
BASOS ABS: 0 10*3/uL (ref 0.0–0.1)
Basophils Relative: 0 %
EOS ABS: 0.1 10*3/uL (ref 0.0–0.5)
EOS PCT: 2 %
HEMATOCRIT: 34.3 % — AB (ref 36.0–46.0)
Hemoglobin: 10.6 g/dL — ABNORMAL LOW (ref 12.0–15.0)
Immature Granulocytes: 1 %
Lymphocytes Relative: 36 %
Lymphs Abs: 2.1 10*3/uL (ref 0.7–4.0)
MCH: 24.1 pg — ABNORMAL LOW (ref 26.0–34.0)
MCHC: 30.9 g/dL (ref 30.0–36.0)
MCV: 78.1 fL — AB (ref 80.0–100.0)
Monocytes Absolute: 0.4 10*3/uL (ref 0.1–1.0)
Monocytes Relative: 7 %
Neutro Abs: 3.2 10*3/uL (ref 1.7–7.7)
Neutrophils Relative %: 54 %
Platelets: 171 10*3/uL (ref 150–400)
RBC: 4.39 MIL/uL (ref 3.87–5.11)
RDW: 15.6 % — AB (ref 11.5–15.5)
WBC: 5.9 10*3/uL (ref 4.0–10.5)
nRBC: 0 % (ref 0.0–0.2)

## 2018-06-14 LAB — TROPONIN I: Troponin I: <0.03 ng/mL (ref <0.03)

## 2018-06-14 NOTE — ED Triage Notes (Signed)
Pt reports waking up this morning with mid sternal/ right sided chest "soreness", pain increases with deep inspiration, certain movements and positions, tender to palp. Pt states she felt sob and dizzy earlier today, none at this time. ekg performed while pt being triaged.

## 2018-06-14 NOTE — ED Provider Notes (Signed)
[facility] EMERGENCY DEPARTMENT Provider Note   CSN: [id number] Arrival date & time: 06/14/18  1747     History   Chief Complaint Chief Complaint  Patient presents with  . Chest Pain    HPI Jeanne Shepard is a 52 y.o. female.  HPI Patient presents with right-sided anterior chest pain.  Began when she woke up this morning.  No trauma.  States it hurts with movement deep breaths and certain positions.  Has not had pains like this before.  No shortness of breath but states it just does hurt to breathe.  No fevers.  No swelling in her legs.  States she has had an episode like this in the past and the EKG was fine but they did not know what caused it.  No rash.  Has hurt all day but the intensity has waxed and waned somewhat.  Took a muscle relaxer without relief. Past Medical History:  Diagnosis Date  . Arthritis   . Asthma   . Diabetes mellitus without complication (HCC)   . Hypertension     Patient Active Problem List   Diagnosis Date Noted  . Neck pain 12/05/2017  . Status post right knee replacement 02/28/2017  . Pulmonary nodule 01/14/2017  . Hyperthyroidism 01/30/2016  . Primary insomnia 08/26/2015  . Depression 05/30/2014  . Polyneuropathy in diabetes (HCC) 04/03/2014  . Obstructive sleep apnea 04/03/2014  . Mixed incontinence 04/03/2014  . Mild intermittent asthma, uncomplicated 04/03/2014  . Migraine without status migrainosus, not intractable 04/03/2014  . Lumbar radiculopathy 04/03/2014  . Gastro-esophageal reflux disease without esophagitis 04/03/2014  . Diabetes mellitus with neurological manifestation (HCC) 04/03/2014  . Cocaine dependence in remission (HCC) 04/03/2014  . Chronic pain syndrome 04/03/2014  . Allergic rhinitis 04/03/2014  . Anxiety disorder 04/03/2014  . Hypercholesterolemia 01/06/2013  . Chronic recurrent major depressive disorder (HCC) 01/06/2013  . Post-traumatic stress disorder 01/06/2013  . 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 SYPHILIS 01/14/2009  . OBESITY 01/14/2009  . CARPAL TUNNEL SYNDROME 01/14/2009  . HYPERTENSION 01/14/2009  . DEGENERATIVE JOINT DISEASE 01/14/2009    Past Surgical History:  Procedure Laterality Date  . CARPAL TUNNEL RELEASE Bilateral   . EYE SURGERY    . REPLACEMENT TOTAL KNEE Bilateral      OB History   None      Home Medications    Prior to Admission medications   Medication Sig Start Date End Date Taking? Authorizing Provider  albuterol (PROAIR HFA) 108 (90 Base) MCG/ACT inhaler Inhale into the lungs.    [provider]  amLODipine (NORVASC) 10 MG tablet TK 1 T PO ONCE DAILY 09/21/17   [provider]  aspirin EC 81 MG tablet Take by mouth. 07/01/17   [provider]  diclofenac (VOLTAREN) 75 MG EC tablet  01/14/18   [provider]  Estradiol 10 MCG TABS vaginal tablet INSERT 1 TABLET INTRAVAGINALLY QHS FOR 2 WEEKS THEN TWICE WEEKLY 11/16/17   [provider]  fluticasone furoate-vilanterol (BREO ELLIPTA) 100-25 MCG/INH AEPB INHALE 1 PUFF INTO THE LUNGS DAILY 12/15/16   [provider]  gabapentin (NEURONTIN) 600 MG tablet  11/17/17   [provider]  lisinopril (PRINIVIL,ZESTRIL) 5 MG tablet TK 1 T PO ONCE DAILY 08/18/17   [provider]  metFORMIN (GLUCOPHAGE-XR) 500 MG 24 hr tablet TK 2 TS PO ATN 10/31/17   [provider]  methimazole (TAPAZOLE) 5 MG tablet  08/27/17   [provider]  methocarbamol (  ROBAXIN) 500 MG tablet Take 1 tablet (500 mg total) by mouth every 8 (eight) hours as needed. 02/16/18   Lenda Kelp, MD  omeprazole (PRILOSEC) 20 MG capsule TK 1 C PO BID 08/29/17   [provider]  pravastatin (PRAVACHOL) 40 MG tablet  10/14/17   [provider]  tiZANidine (ZANAFLEX) 4 MG tablet Take 1 tablet (4 mg total) by mouth every 8 (eight) hours as needed for muscle spasms. 04/28/18   Lenda Kelp, MD  Vitamin D, Ergocalciferol,  (DRISDOL) 50000 units CAPS capsule Take by mouth. 03/31/17   [provider]  zolpidem (AMBIEN) 10 MG tablet TK 1 T PO Q NIGHT PRF SLP 10/26/17   [provider]    Family History History reviewed. No pertinent family history.  Social History Social History   Tobacco Use  . Smoking status: Never Smoker  . Smokeless tobacco: Never Used  Substance Use Topics  . Alcohol use: Yes  . Drug use: Not Currently     Allergies   Tramadol and Vicodin [hydrocodone-acetaminophen]   Review of Systems Review of Systems  Constitutional: Negative for appetite change.  HENT: Negative for congestion.   Respiratory: Negative for shortness of breath.   Cardiovascular: Positive for chest pain.  Gastrointestinal: Negative for abdominal distention.  Genitourinary: Negative for flank pain.  Musculoskeletal: Positive for neck pain. Negative for back pain.  Skin: Negative for rash and wound.  Neurological: Negative for weakness.  Hematological: Negative for adenopathy.  Psychiatric/Behavioral: Negative for confusion.     Physical Exam Updated Vital Signs BP 135/78   Pulse 79   Temp 98.4 F (36.9 C) (Oral)   Resp 18   Ht 5\' 1"  (1.549 m)   Wt 93.4 kg   SpO2 100%   BMI 38.92 kg/m   Physical Exam  Constitutional: She appears well-developed.  HENT:  Head: Normocephalic.  Eyes: EOM are normal.  Neck: Normal range of motion.  Cardiovascular: Normal rate.  Pulmonary/Chest: Effort normal. She has no wheezes. She has no rales.  Moderate tenderness to right upper anterior chest wall.  No rash.  No crepitance or deformity.  Musculoskeletal:       Right lower leg: She exhibits no edema.       Left lower leg: She exhibits no edema.  Neurological: She is alert.  Skin: Skin is warm. Capillary refill takes less than 2 seconds.     ED Treatments / Results  Labs (all labs ordered are listed, but only abnormal results are displayed) Labs Reviewed  CBC WITH  DIFFERENTIAL/PLATELET - Abnormal; Notable for the following components:      Result Value   Hemoglobin 10.6 (*)    HCT 34.3 (*)    MCV 78.1 (*)    MCH 24.1 (*)    RDW 15.6 (*)    All other components within normal limits  BASIC METABOLIC PANEL - Abnormal; Notable for the following components:   Potassium 3.2 (*)    Glucose, Bld 132 (*)    All other components within normal limits  TROPONIN I    EKG EKG Interpretation  Date/Time:  Wednesday June 14 2018 17:52:35 EST Ventricular Rate:  74 PR Interval:  136 QRS Duration: 70 QT Interval:  380 QTC Calculation: 421 R Axis:   53 Text Interpretation:  Normal sinus rhythm Normal ECG Confirmed by Benjiman Core [phone]) on 06/14/2018 7:03:01 PM   Radiology Dg Chest 2 View  Result Date: 06/14/2018 CLINICAL DATA:  Chest pain for 1 day.  Asthma. EXAM: CHEST - 2 VIEW COMPARISON:  None. FINDINGS: The heart size and mediastinal contours are within normal limits. Both lungs are clear. Mild pectus excavatum noted. IMPRESSION: No active cardiopulmonary disease. Electronically Signed   By: Myles Rosenthal M.D.   On: 06/14/2018 20:14    Procedures Procedures (including critical care time)  Medications Ordered in ED Medications - No data to display   Initial Impression / Assessment and Plan / ED Course  I have reviewed the triage vital signs and the nursing notes.  Pertinent labs & imaging results that were available during my care of the patient were reviewed by me and considered in my medical decision making (see chart for details).     Patient with right-sided chest pain.  Very reproducible.  Does not appear to be pulmonary embolus and.  X-ray and work-up reassuring.  Already on several different medications at home including Robaxin.  Will discharge home.  Final Clinical Impressions(s) / ED Diagnoses   Final diagnoses:  Chest wall pain    ED Discharge Orders    None       Benjiman Core, MD 06/14/18 2141

## 2018-06-24 ENCOUNTER — Other Ambulatory Visit: Payer: Self-pay | Admitting: Family Medicine

## 2018-09-08 ENCOUNTER — Other Ambulatory Visit: Payer: Self-pay | Admitting: Family Medicine

## 2020-03-06 ENCOUNTER — Encounter (HOSPITAL_BASED_OUTPATIENT_CLINIC_OR_DEPARTMENT_OTHER): Payer: Self-pay | Admitting: Emergency Medicine

## 2020-03-06 ENCOUNTER — Emergency Department (HOSPITAL_BASED_OUTPATIENT_CLINIC_OR_DEPARTMENT_OTHER)
Admission: EM | Admit: 2020-03-06 | Discharge: 2020-03-07 | Disposition: A | Discharge status: Home / Self Care | Payer: Medicare Other | Attending: Emergency Medicine | Admitting: Emergency Medicine

## 2020-03-06 ENCOUNTER — Emergency Department (HOSPITAL_BASED_OUTPATIENT_CLINIC_OR_DEPARTMENT_OTHER): Payer: Medicare Other

## 2020-03-06 ENCOUNTER — Other Ambulatory Visit: Payer: Self-pay

## 2020-03-06 DIAGNOSIS — E1140 Type 2 diabetes mellitus with diabetic neuropathy, unspecified: Secondary | ICD-10-CM | POA: Diagnosis not present

## 2020-03-06 DIAGNOSIS — U071 COVID-19: Secondary | ICD-10-CM | POA: Diagnosis not present

## 2020-03-06 DIAGNOSIS — Z7982 Long term (current) use of aspirin: Secondary | ICD-10-CM | POA: Diagnosis not present

## 2020-03-06 DIAGNOSIS — J45909 Unspecified asthma, uncomplicated: Secondary | ICD-10-CM | POA: Diagnosis not present

## 2020-03-06 DIAGNOSIS — I10 Essential (primary) hypertension: Secondary | ICD-10-CM | POA: Diagnosis not present

## 2020-03-06 DIAGNOSIS — Z87891 Personal history of nicotine dependence: Secondary | ICD-10-CM | POA: Diagnosis not present

## 2020-03-06 DIAGNOSIS — Z79899 Other long term (current) drug therapy: Secondary | ICD-10-CM | POA: Diagnosis not present

## 2020-03-06 DIAGNOSIS — Z7951 Long term (current) use of inhaled steroids: Secondary | ICD-10-CM | POA: Diagnosis not present

## 2020-03-06 DIAGNOSIS — R519 Headache, unspecified: Secondary | ICD-10-CM | POA: Diagnosis present

## 2020-03-06 DIAGNOSIS — Z7984 Long term (current) use of oral hypoglycemic drugs: Secondary | ICD-10-CM | POA: Insufficient documentation

## 2020-03-06 HISTORY — DX: Polyneuropathy, unspecified: G62.9

## 2020-03-06 LAB — SARS CORONAVIRUS 2 BY RT PCR (HOSPITAL ORDER, PERFORMED IN ~~LOC~~ HOSPITAL LAB): SARS Coronavirus 2: POSITIVE — AB

## 2020-03-06 NOTE — ED Notes (Signed)
Date and time results received: 03/06/20 9:51 PM (use smartphrase ".now" to insert current time)  Test: COVID-19 Critical Value: Positive  Name of Provider Notified: Dr. Deretha Emory and primary RN Adam  Orders Received? Or Actions Taken?: no new orders at this time

## 2020-03-06 NOTE — Discharge Instructions (Addendum)
Covid test was positive.  Chest x-ray without evidence of pneumonia.  10 you to take your vitamin D.  Recommend getting a multivitamin and a zinc supplement to take.  Passed on your information to the [facility] for consideration for infusion of the monoclonal antibody.  Also contact your primary care doctor they may have a contacted get this done at [facility] or [facility].  Isolate yourself at home.  Return or get seen for any shortness of breath.  Or for low oxygen levels.

## 2020-03-06 NOTE — ED Triage Notes (Signed)
Headache, facial pain, eye pain, neck pain, back pain, body aches x2-3 days. Denies n/v/d. Does not know if she has had a fever or not.  No chills.  Some cough.

## 2020-03-06 NOTE — ED Provider Notes (Signed)
[facility] EMERGENCY DEPARTMENT Provider Note   CSN: [id number] Arrival date & time: 03/06/20  1709     History Chief Complaint  Patient presents with  . Flu-like symptoms    Jeanne Shepard is a 54 y.o. female.  Patient primary care doctors in [facility] area.  Patient with complaint of some mild headache some facial pain but not sinus pain.  Some body aches.  Which include some neck pain and back pain but no neck stiffness or stiffness to the low back.  The symptoms been ongoing for 2 to 3 days.  Associated with a little bit of a cough.  No fevers no chills no nausea vomiting or diarrhea.  Primary care doctors note seem to imply an exposure to somebody who may have been Covid positive on July 30 but patient denies any such exposure here.  Past medical history significant for diabetes hypertension and asthma.  Patient's temperature here is 99.2.  Blood pressure 155/81.  Oxygen saturations on room air is 100%.  Patient has not had any of the Covid vaccines        Past Medical History:  Diagnosis Date  . Arthritis   . Asthma   . Diabetes mellitus without complication (HCC)   . Hypertension   . Neuropathy     Patient Active Problem List   Diagnosis Date Noted  . Neck pain 12/05/2017  . Status post right knee replacement 02/28/2017  . Pulmonary nodule 01/14/2017  . Hyperthyroidism 01/30/2016  . Primary insomnia 08/26/2015  . Depression 05/30/2014  . Polyneuropathy in diabetes (HCC) 04/03/2014  . Obstructive sleep apnea 04/03/2014  . Mixed incontinence 04/03/2014  . Mild intermittent asthma, uncomplicated 04/03/2014  . Migraine without status migrainosus, not intractable 04/03/2014  . Lumbar radiculopathy 04/03/2014  . Gastro-esophageal reflux disease without esophagitis 04/03/2014  . Diabetes mellitus with neurological manifestation (HCC) 04/03/2014  . Cocaine dependence in remission (HCC) 04/03/2014  . Chronic pain syndrome 04/03/2014  . Allergic rhinitis  04/03/2014  . Anxiety disorder 04/03/2014  . Hypercholesterolemia 01/06/2013  . Chronic recurrent major depressive disorder (HCC) 01/06/2013  . Post-traumatic stress disorder 01/06/2013  . 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 SYPHILIS 01/14/2009  . OBESITY 01/14/2009  . CARPAL TUNNEL SYNDROME 01/14/2009  . HYPERTENSION 01/14/2009  . DEGENERATIVE JOINT DISEASE 01/14/2009    Past Surgical History:  Procedure Laterality Date  . CARPAL TUNNEL RELEASE Bilateral   . EYE SURGERY    . REPLACEMENT TOTAL KNEE Bilateral      OB History   No obstetric history on file.     No family history on file.  Social History   Tobacco Use  . Smoking status: Former Games developer  . Smokeless tobacco: Never Used  Substance Use Topics  . Alcohol use: Not Currently  . Drug use: Not Currently    Home Medications Prior to Admission medications   Medication Sig Start Date End Date Taking? Authorizing Provider  albuterol (PROAIR HFA) 108 (90 Base) MCG/ACT inhaler Inhale into the lungs.    [provider]  amLODipine (NORVASC) 10 MG tablet TK 1 T PO ONCE DAILY 09/21/17   [provider]  aspirin EC 81 MG tablet Take by mouth. 07/01/17   [provider]  diclofenac (VOLTAREN) 75 MG EC tablet  01/14/18   [provider]  Estradiol 10 MCG TABS vaginal tablet INSERT 1 TABLET INTRAVAGINALLY QHS FOR 2 WEEKS THEN TWICE WEEKLY 11/16/17   [provider]  fluticasone furoate-vilanterol (BREO  ELLIPTA) 100-25 MCG/INH AEPB INHALE 1 PUFF INTO THE LUNGS DAILY 12/15/16   [provider]  gabapentin (NEURONTIN) 600 MG tablet  11/17/17   [provider]  lisinopril (PRINIVIL,ZESTRIL) 5 MG tablet TK 1 T PO ONCE DAILY 08/18/17   [provider]  metFORMIN (GLUCOPHAGE-XR) 500 MG 24 hr tablet TK 2 TS PO ATN 10/31/17   [provider]  methimazole (TAPAZOLE) 5 MG tablet  08/27/17   [provider]  methocarbamol (ROBAXIN) 500  MG tablet Take 1 tablet (500 mg total) by mouth every 8 (eight) hours as needed. 02/16/18   Lenda Kelp, MD  omeprazole (PRILOSEC) 20 MG capsule TK 1 C PO BID 08/29/17   [provider]  pravastatin (PRAVACHOL) 40 MG tablet  10/14/17   [provider]  tiZANidine (ZANAFLEX) 4 MG tablet TAKE 1 TABLET(4 MG) BY MOUTH EVERY 8 HOURS AS NEEDED FOR MUSCLE SPASMS 06/27/18   Hudnall, Azucena Fallen, MD  Vitamin D, Ergocalciferol, (DRISDOL) 50000 units CAPS capsule Take by mouth. 03/31/17   [provider]  zolpidem (AMBIEN) 10 MG tablet TK 1 T PO Q NIGHT PRF SLP 10/26/17   [provider]    Allergies    Tramadol and Vicodin [hydrocodone-acetaminophen]  Review of Systems   Review of Systems  Constitutional: Positive for fatigue. Negative for chills and fever.  HENT: Negative for congestion, rhinorrhea and sore throat.   Eyes: Negative for visual disturbance.  Respiratory: Positive for cough. Negative for shortness of breath.   Cardiovascular: Negative for chest pain and leg swelling.  Gastrointestinal: Negative for abdominal pain, diarrhea, nausea and vomiting.  Genitourinary: Negative for dysuria.  Musculoskeletal: Positive for back pain, myalgias and neck pain.  Skin: Negative for rash.  Neurological: Positive for headaches. Negative for dizziness and light-headedness.  Hematological: Does not bruise/bleed easily.  Psychiatric/Behavioral: Negative for confusion.    Physical Exam Updated Vital Signs BP 127/88 (BP Location: Right Arm)   Pulse 85   Temp 99.2 F (37.3 C) (Oral)   Resp 12   Ht 1.549 m (5\' 1" )   Wt 97 kg   SpO2 97%   BMI 40.40 kg/m   Physical Exam Vitals and nursing note reviewed.  Constitutional:      General: She is not in acute distress.    Appearance: Normal appearance. She is well-developed.  HENT:     Head: Normocephalic and atraumatic.  Eyes:     Extraocular Movements: Extraocular movements intact.     Conjunctiva/sclera:  Conjunctivae normal.     Pupils: Pupils are equal, round, and reactive to light.  Cardiovascular:     Rate and Rhythm: Normal rate and regular rhythm.     Heart sounds: No murmur heard.   Pulmonary:     Effort: Pulmonary effort is normal. No respiratory distress.     Breath sounds: Normal breath sounds. No wheezing.  Abdominal:     Palpations: Abdomen is soft.     Tenderness: There is no abdominal tenderness.  Musculoskeletal:        General: Normal range of motion.     Cervical back: Normal range of motion and neck supple.  Skin:    General: Skin is warm and dry.     Capillary Refill: Capillary refill takes less than 2 seconds.  Neurological:     General: No focal deficit present.     Mental Status: She is alert and oriented to person, place, and time.     Cranial Nerves: No cranial  nerve deficit.     Sensory: No sensory deficit.     Motor: No weakness.     ED Results / Procedures / Treatments   Labs (all labs ordered are listed, but only abnormal results are displayed) Labs Reviewed  SARS CORONAVIRUS 2 BY RT PCR (HOSPITAL ORDER, PERFORMED IN [facility]) - Abnormal; Notable for the following components:      Result Value   SARS Coronavirus 2 POSITIVE (*)    All other components within normal limits    EKG None  Radiology DG Chest Port 1 View  Result Date: 03/06/2020 CLINICAL DATA:  Headache and facial pain. EXAM: PORTABLE CHEST 1 VIEW COMPARISON:  June 14, 2018 FINDINGS: There is no evidence of acute infiltrate, pleural effusion or pneumothorax. The heart size and mediastinal contours are within normal limits. A radiopaque fixation plate and screws are seen overlying the lower cervical spine. The visualized skeletal structures are unremarkable. IMPRESSION: No active disease. Electronically Signed   By: Aram Candela M.D.   On: 03/06/2020 22:02    Procedures Procedures (including critical care time)  Medications Ordered in ED Medications - No  data to display  ED Course  I have reviewed the triage vital signs and the nursing notes.  Pertinent labs & imaging results that were available during my care of the patient were reviewed by me and considered in my medical decision making (see chart for details).    MDM Rules/Calculators/A&P                          Patient nontoxic no acute distress.  Chest x-ray negative for any pneumonia or abnormalities.  However Covid test is positive.  Patient is now had symptoms for about 3 days.  Oxygen saturations here 100% on room air.  Patient without any respiratory distress.  Will place information into [facility] for the monoclonal antibody infusion consideration.  We will also have patient follow-up with her primary care doctor by phone.  Patient take vitamin D zinc supplement multivitamin.  Patient already taking vitamin D.  Which is good.   Patient will return or follow-up with her doctor for any shortness of breath or difficulty breathing. Final Clinical Impression(s) / ED Diagnoses Final diagnoses:  COVID-19 virus infection    Rx / DC Orders ED Discharge Orders    None       Vanetta Mulders, MD 03/06/20 2332

## 2020-03-07 ENCOUNTER — Other Ambulatory Visit: Payer: Self-pay | Admitting: Oncology

## 2020-03-07 DIAGNOSIS — U071 COVID-19: Secondary | ICD-10-CM

## 2020-03-07 NOTE — Progress Notes (Signed)
I connected by phone with  Mrs. Kingbird on 03/07/2020 at 0330pm to discuss the potential use of an new treatment for mild to moderate COVID-19 viral infection in non-hospitalized patients.   This patient is a age/sex that meets the FDA criteria for Emergency Use Authorization of casirivimab\imdevimab.  Has a (+) direct SARS-CoV-2 viral test result 1. Has mild or moderate COVID-19  2. Is ? 55 years of age and weighs ? 40 kg 3. Is NOT hospitalized due to COVID-19 4. Is NOT requiring oxygen therapy or requiring an increase in baseline oxygen flow rate due to COVID-19 5. Is within 10 days of symptom onset 6. Has at least one of the high risk factor(s) for progression to severe COVID-19 and/or hospitalization as defined in EUA. ? Specific high risk criteria :DM   Symptom onset  03/04/2020.   I have spoken and communicated the following to the patient or parent/caregiver:   1. FDA has authorized the emergency use of casirivimab\imdevimab for the treatment of mild to moderate COVID-19 in adults and pediatric patients with positive results of direct SARS-CoV-2 viral testing who are 6 years of age and older weighing at least 40 kg, and who are at high risk for progressing to severe COVID-19 and/or hospitalization.   2. The significant known and potential risks and benefits of casirivimab\imdevimab, and the extent to which such potential risks and benefits are unknown.   3. Information on available alternative treatments and the risks and benefits of those alternatives, including clinical trials.   4. Patients treated with casirivimab\imdevimab should continue to self-isolate and use infection control measures (e.g., wear mask, isolate, social distance, avoid sharing personal items, clean and disinfect "high touch" surfaces, and frequent handwashing) according to CDC guidelines.    5. The patient or parent/caregiver has the option to accept or refuse casirivimab\imdevimab .   After reviewing this  information with the patient, The patient agreed to proceed with receiving casirivimab\imdevimab infusion and will be provided a copy of the Fact sheet prior to receiving the infusion.Mignon Pine, AGNP-C [phone] (Infusion Center Hotline)

## 2020-03-09 MED ORDER — SODIUM CHLORIDE 0.9 % IV SOLN
1200.0000 mg | Freq: Once | INTRAVENOUS | Status: AC
  Administered 2020-03-10: 1200 mg via INTRAVENOUS
  Filled 2020-03-09: qty 1200

## 2020-03-10 ENCOUNTER — Ambulatory Visit (HOSPITAL_COMMUNITY)
Admission: RE | Admit: 2020-03-10 | Discharge: 2020-03-10 | Disposition: A | Discharge status: Home / Self Care | Payer: Medicare Other | Source: Ambulatory Visit | Attending: Pulmonary Disease | Admitting: Pulmonary Disease

## 2020-03-10 DIAGNOSIS — U071 COVID-19: Secondary | ICD-10-CM | POA: Diagnosis present

## 2020-03-10 DIAGNOSIS — Z23 Encounter for immunization: Secondary | ICD-10-CM | POA: Diagnosis not present

## 2020-03-10 MED ORDER — METHYLPREDNISOLONE SODIUM SUCC 125 MG IJ SOLR: 125.0000 mg | Freq: Once | INTRAMUSCULAR | Status: DC | PRN

## 2020-03-10 MED ORDER — SODIUM CHLORIDE 0.9 % IV SOLN: INTRAVENOUS | Status: DC | PRN

## 2020-03-10 MED ORDER — DIPHENHYDRAMINE HCL 50 MG/ML IJ SOLN: 50.0000 mg | Freq: Once | INTRAMUSCULAR | Status: DC | PRN

## 2020-03-10 MED ORDER — FAMOTIDINE IN NACL 20-0.9 MG/50ML-% IV SOLN: 20.0000 mg | Freq: Once | INTRAVENOUS | Status: DC | PRN

## 2020-03-10 MED ORDER — EPINEPHRINE 0.3 MG/0.3ML IJ SOAJ: 0.3000 mg | Freq: Once | INTRAMUSCULAR | Status: DC | PRN

## 2020-03-10 MED ORDER — ALBUTEROL SULFATE HFA 108 (90 BASE) MCG/ACT IN AERS: 2.0000 | INHALATION_SPRAY | Freq: Once | RESPIRATORY_TRACT | Status: DC | PRN

## 2020-03-10 NOTE — Discharge Instructions (Signed)

## 2020-03-10 NOTE — Progress Notes (Signed)
  Diagnosis: COVID-19  Physician: Dr. Wright  Procedure: [facility] Med: casirivimab\imdevimab infusion - Provided patient with casirivimab\imdevimab fact sheet for patients, parents and caregivers prior to infusion.  Complications: No immediate complications noted.  Discharge: Discharged home   Jeanne Shepard M Jeanne Shepard 03/10/2020  

## 2020-05-23 ENCOUNTER — Emergency Department (HOSPITAL_BASED_OUTPATIENT_CLINIC_OR_DEPARTMENT_OTHER): Payer: Medicare Other

## 2020-05-23 ENCOUNTER — Emergency Department (HOSPITAL_BASED_OUTPATIENT_CLINIC_OR_DEPARTMENT_OTHER)
Admission: EM | Admit: 2020-05-23 | Discharge: 2020-05-23 | Disposition: A | Discharge status: Home / Self Care | Payer: Medicare Other | Attending: Emergency Medicine | Admitting: Emergency Medicine

## 2020-05-23 ENCOUNTER — Encounter (HOSPITAL_BASED_OUTPATIENT_CLINIC_OR_DEPARTMENT_OTHER): Payer: Self-pay | Admitting: Emergency Medicine

## 2020-05-23 ENCOUNTER — Other Ambulatory Visit: Payer: Self-pay

## 2020-05-23 DIAGNOSIS — Z96653 Presence of artificial knee joint, bilateral: Secondary | ICD-10-CM | POA: Insufficient documentation

## 2020-05-23 DIAGNOSIS — R079 Chest pain, unspecified: Secondary | ICD-10-CM

## 2020-05-23 DIAGNOSIS — Z87891 Personal history of nicotine dependence: Secondary | ICD-10-CM | POA: Insufficient documentation

## 2020-05-23 DIAGNOSIS — Z79899 Other long term (current) drug therapy: Secondary | ICD-10-CM | POA: Diagnosis not present

## 2020-05-23 DIAGNOSIS — M542 Cervicalgia: Secondary | ICD-10-CM | POA: Insufficient documentation

## 2020-05-23 DIAGNOSIS — Z7951 Long term (current) use of inhaled steroids: Secondary | ICD-10-CM | POA: Diagnosis not present

## 2020-05-23 DIAGNOSIS — I10 Essential (primary) hypertension: Secondary | ICD-10-CM | POA: Insufficient documentation

## 2020-05-23 DIAGNOSIS — J45909 Unspecified asthma, uncomplicated: Secondary | ICD-10-CM | POA: Diagnosis not present

## 2020-05-23 DIAGNOSIS — Z7984 Long term (current) use of oral hypoglycemic drugs: Secondary | ICD-10-CM | POA: Insufficient documentation

## 2020-05-23 DIAGNOSIS — Z7982 Long term (current) use of aspirin: Secondary | ICD-10-CM | POA: Diagnosis not present

## 2020-05-23 DIAGNOSIS — M62838 Other muscle spasm: Secondary | ICD-10-CM

## 2020-05-23 DIAGNOSIS — E1149 Type 2 diabetes mellitus with other diabetic neurological complication: Secondary | ICD-10-CM | POA: Insufficient documentation

## 2020-05-23 DIAGNOSIS — R519 Headache, unspecified: Secondary | ICD-10-CM | POA: Diagnosis not present

## 2020-05-23 LAB — PREGNANCY, URINE: Preg Test, Ur: NEGATIVE

## 2020-05-23 LAB — CBC
HCT: 35.2 % — ABNORMAL LOW (ref 36.0–46.0)
Hemoglobin: 11.2 g/dL — ABNORMAL LOW (ref 12.0–15.0)
MCH: 23.2 pg — ABNORMAL LOW (ref 26.0–34.0)
MCHC: 31.8 g/dL (ref 30.0–36.0)
MCV: 73 fL — ABNORMAL LOW (ref 80.0–100.0)
Platelets: 212 10*3/uL (ref 150–400)
RBC: 4.82 MIL/uL (ref 3.87–5.11)
RDW: 18.2 % — ABNORMAL HIGH (ref 11.5–15.5)
WBC: 6 10*3/uL (ref 4.0–10.5)
nRBC: 0 % (ref 0.0–0.2)

## 2020-05-23 LAB — COMPREHENSIVE METABOLIC PANEL
ALT: 25 U/L (ref 0–44)
AST: 21 U/L (ref 15–41)
Albumin: 4.1 g/dL (ref 3.5–5.0)
Alkaline Phosphatase: 94 U/L (ref 38–126)
Anion gap: 9 (ref 5–15)
BUN: 10 mg/dL (ref 6–20)
CO2: 25 mmol/L (ref 22–32)
Calcium: 9 mg/dL (ref 8.9–10.3)
Chloride: 106 mmol/L (ref 98–111)
Creatinine, Ser: 1 mg/dL (ref 0.44–1.00)
GFR, Estimated: >60 mL/min (ref >60)
Glucose, Bld: 101 mg/dL — ABNORMAL HIGH (ref 70–99)
Potassium: 3.8 mmol/L (ref 3.5–5.1)
Sodium: 140 mmol/L (ref 135–145)
Total Bilirubin: 0.1 mg/dL — ABNORMAL LOW (ref 0.3–1.2)
Total Protein: 7.6 g/dL (ref 6.5–8.1)

## 2020-05-23 LAB — TROPONIN I (HIGH SENSITIVITY)
Troponin I (High Sensitivity): 5 ng/L (ref <18)
Troponin I (High Sensitivity): 5 ng/L (ref <18)

## 2020-05-23 MED ORDER — IOHEXOL 300 MG/ML  SOLN
75.0000 mL | Freq: Once | INTRAMUSCULAR | Status: AC | PRN
  Administered 2020-05-23: 75 mL via INTRAVENOUS

## 2020-05-23 NOTE — Discharge Instructions (Signed)
Your history, exam, and work-up today are most consistent with a muscle cramp and spasm causing the abnormality in your neck and chest.  Your work-up did not show evidence of a cardiac cause of your discomfort and with your negative heart enzymes, reassuring x-ray, and reassuring EKG.  The CT scan did not show some pocket of infection or other acute abnormality in your neck but did show a small thyroid nodule we discussed.  Please follow-up on the thyroid test we ordered and collected for you for your PCP.  Please rest and stay hydrated.  Please continue your home medications including muscle relaxant to help with that discomfort.  If any symptoms change or worsen, please return to the nearest emergency department.

## 2020-05-23 NOTE — ED Provider Notes (Signed)
[facility] EMERGENCY DEPARTMENT Provider Note   CSN: [id number] Arrival date & time: 05/23/20  1543     History Chief Complaint  Patient presents with  . Chest Pain  . Facial Pain    Jeanne Shepard is a 54 y.o. female.  The history is provided by the patient and medical records. No language interpreter was used.  Sore Throat This is a new problem. The current episode started more than 2 days ago. The problem occurs constantly. The problem has not changed since onset.Associated symptoms include chest pain and headaches. Pertinent negatives include no abdominal pain and no shortness of breath. Nothing aggravates the symptoms. Nothing relieves the symptoms. She has tried nothing for the symptoms. The treatment provided no relief.       Past Medical History:  Diagnosis Date  . Arthritis   . Asthma   . Diabetes mellitus without complication (HCC)   . Hypertension   . Neuropathy     Patient Active Problem List   Diagnosis Date Noted  . Neck pain 12/05/2017  . Status post right knee replacement 02/28/2017  . Pulmonary nodule 01/14/2017  . Hyperthyroidism 01/30/2016  . Primary insomnia 08/26/2015  . Depression 05/30/2014  . Polyneuropathy in diabetes (HCC) 04/03/2014  . Obstructive sleep apnea 04/03/2014  . Mixed incontinence 04/03/2014  . Mild intermittent asthma, uncomplicated 04/03/2014  . Migraine without status migrainosus, not intractable 04/03/2014  . Lumbar radiculopathy 04/03/2014  . Gastro-esophageal reflux disease without esophagitis 04/03/2014  . Diabetes mellitus with neurological manifestation (HCC) 04/03/2014  . Cocaine dependence in remission (HCC) 04/03/2014  . Chronic pain syndrome 04/03/2014  . Allergic rhinitis 04/03/2014  . Anxiety disorder 04/03/2014  . Hypercholesterolemia 01/06/2013  . Chronic recurrent major depressive disorder (HCC) 01/06/2013  . Post-traumatic stress disorder 01/06/2013  . Mechanical complication due to corneal  graft 08/13/2011  . SYPHILIS 01/14/2009  . OBESITY 01/14/2009  . CARPAL TUNNEL SYNDROME 01/14/2009  . HYPERTENSION 01/14/2009  . DEGENERATIVE JOINT DISEASE 01/14/2009    Past Surgical History:  Procedure Laterality Date  . CARPAL TUNNEL RELEASE Bilateral   . EYE SURGERY    . REPLACEMENT TOTAL KNEE Bilateral      OB History   No obstetric history on file.     No family history on file.  Social History   Tobacco Use  . Smoking status: Former Games developer  . Smokeless tobacco: Never Used  Substance Use Topics  . Alcohol use: Not Currently  . Drug use: Not Currently    Home Medications Prior to Admission medications   Medication Sig Start Date End Date Taking? Authorizing Provider  albuterol (PROAIR HFA) 108 (90 Base) MCG/ACT inhaler Inhale into the lungs.    [provider]  amLODipine (NORVASC) 10 MG tablet TK 1 T PO ONCE DAILY 09/21/17   [provider]  aspirin EC 81 MG tablet Take by mouth. 07/01/17   [provider]  diclofenac (VOLTAREN) 75 MG EC tablet  01/14/18   [provider]  Estradiol 10 MCG TABS vaginal tablet INSERT 1 TABLET INTRAVAGINALLY QHS FOR 2 WEEKS THEN TWICE WEEKLY 11/16/17   [provider]  fluticasone furoate-vilanterol (BREO ELLIPTA) 100-25 MCG/INH AEPB INHALE 1 PUFF INTO THE LUNGS DAILY 12/15/16   [provider]  gabapentin (NEURONTIN) 600 MG tablet  11/17/17   [provider]  lisinopril (PRINIVIL,ZESTRIL) 5 MG tablet TK 1 T PO ONCE DAILY 08/18/17   [provider]  metFORMIN (GLUCOPHAGE-XR) 500 MG 24 hr tablet TK 2  TS PO ATN 10/31/17   [provider]  methimazole (TAPAZOLE) 5 MG tablet  08/27/17   [provider]  methocarbamol (ROBAXIN) 500 MG tablet Take 1 tablet (500 mg total) by mouth every 8 (eight) hours as needed. 02/16/18   Lenda Kelp, MD  omeprazole (PRILOSEC) 20 MG capsule TK 1 C PO BID 08/29/17   [provider]  pravastatin (PRAVACHOL) 40 MG  tablet  10/14/17   [provider]  tiZANidine (ZANAFLEX) 4 MG tablet TAKE 1 TABLET(4 MG) BY MOUTH EVERY 8 HOURS AS NEEDED FOR MUSCLE SPASMS 06/27/18   Hudnall, Azucena Fallen, MD  Vitamin D, Ergocalciferol, (DRISDOL) 50000 units CAPS capsule Take by mouth. 03/31/17   [provider]  zolpidem (AMBIEN) 10 MG tablet TK 1 T PO Q NIGHT PRF SLP 10/26/17   [provider]    Allergies    Tramadol and Vicodin [hydrocodone-acetaminophen]  Review of Systems   Review of Systems  Constitutional: Negative for diaphoresis, fatigue and fever.  HENT: Positive for facial swelling (neck) and sore throat. Negative for congestion, rhinorrhea, sneezing, trouble swallowing and voice change.   Respiratory: Negative for cough, chest tightness, shortness of breath and wheezing.   Cardiovascular: Positive for chest pain. Negative for palpitations and leg swelling.  Gastrointestinal: Negative for abdominal pain, constipation, diarrhea, nausea and vomiting.  Genitourinary: Negative for dysuria.  Musculoskeletal: Positive for neck pain. Negative for back pain and neck stiffness.  Neurological: Positive for headaches. Negative for dizziness, tremors, weakness and light-headedness.  Psychiatric/Behavioral: Negative for agitation and confusion.  All other systems reviewed and are negative.   Physical Exam Updated Vital Signs BP (!) 155/65 (BP Location: Right Arm)   Pulse 76   Temp 98.9 F (37.2 C) (Oral)   Resp 15   Ht 5\' 1"  (1.549 m)   Wt 98.4 kg   SpO2 100%   BMI 41.00 kg/m   Physical Exam Vitals and nursing note reviewed.  Constitutional:      General: She is not in acute distress.    Appearance: She is well-developed. She is not ill-appearing, toxic-appearing or diaphoretic.  HENT:     Head: Atraumatic. No contusion or laceration.     Jaw: Tenderness present.      Right Ear: External ear normal.     Left Ear: External ear normal.     Nose: Nose normal.     Mouth/Throat:      Pharynx: No oropharyngeal exudate.  Eyes:     Conjunctiva/sclera: Conjunctivae normal.     Pupils: Pupils are equal, round, and reactive to light.  Cardiovascular:     Rate and Rhythm: Normal rate and regular rhythm.     Heart sounds: Normal heart sounds.  Pulmonary:     Effort: No tachypnea or respiratory distress.     Breath sounds: No stridor. No decreased breath sounds, wheezing, rhonchi or rales.  Chest:     Chest wall: Tenderness present.  Abdominal:     General: There is no distension.     Tenderness: There is no abdominal tenderness. There is no rebound.  Musculoskeletal:     Cervical back: Normal range of motion and neck supple.     Right lower leg: No tenderness. No edema.     Left lower leg: No tenderness. No edema.  Skin:    General: Skin is warm.     Capillary Refill: Capillary refill takes less than 2 seconds.     Findings: No erythema or rash.  Nails: There is no clubbing.  Neurological:     General: No focal deficit present.     Mental Status: She is alert and oriented to person, place, and time.     Motor: No abnormal muscle tone.     Coordination: Coordination normal.     Deep Tendon Reflexes: Reflexes are normal and symmetric.     ED Results / Procedures / Treatments   Labs (all labs ordered are listed, but only abnormal results are displayed) Labs Reviewed  CBC - Abnormal; Notable for the following components:      Result Value   Hemoglobin 11.2 (*)    HCT 35.2 (*)    MCV 73.0 (*)    MCH 23.2 (*)    RDW 18.2 (*)    All other components within normal limits  COMPREHENSIVE METABOLIC PANEL - Abnormal; Notable for the following components:   Glucose, Bld 101 (*)    Total Bilirubin 0.1 (*)    All other components within normal limits  PREGNANCY, URINE  TSH  T3, FREE  T4, FREE  TROPONIN I (HIGH SENSITIVITY)  TROPONIN I (HIGH SENSITIVITY)    EKG EKG Interpretation  Date/Time:  Friday May 23 2020 16:04:27 EDT Ventricular Rate:  89 PR  Interval:  138 QRS Duration: 62 QT Interval:  280 QTC Calculation: 340 R Axis:   44 Text Interpretation: Normal sinus rhythm Nonspecific T wave abnormality Abnormal ECG When compared to prior, similar apperance. No STEMI Confirmed by Theda Belfast ([id number]) on 05/23/2020 6:49:21 PM   Radiology DG Chest 2 View  Result Date: 05/23/2020 CLINICAL DATA:  Chest pain EXAM: CHEST - 2 VIEW COMPARISON:  03/06/2020 chest radiograph and prior. FINDINGS: The heart size and mediastinal contours are within normal limits. Both lungs are clear. No pneumothorax or pleural effusion. Sequela of ACDF. IMPRESSION: No focal airspace disease. Electronically Signed   By: Stana Bunting M.D.   On: 05/23/2020 16:59   CT Soft Tissue Neck W Contrast  Result Date: 05/23/2020 CLINICAL DATA:  Initial evaluation for acute right neck pain, swelling, tenderness. EXAM: CT NECK WITH CONTRAST TECHNIQUE: Multidetector CT imaging of the neck was performed using the standard protocol following the bolus administration of intravenous contrast. CONTRAST:  75mL OMNIPAQUE IOHEXOL 300 MG/ML  SOLN COMPARISON:  None available. FINDINGS: Pharynx and larynx: Oral cavity within normal limits. Palatine tonsils symmetric and within normal limits. Oropharynx and nasopharynx normal. No retropharyngeal collection or swelling. Epiglottis within normal limits. Vallecula largely effaced by the lingual tonsils. Remainder of the hypopharynx and supraglottic larynx within normal limits. Glottis normal. Subglottic airway clear. Salivary glands: Salivary glands including the parotid and submandibular glands are within normal limits. Thyroid: Subtle 5 mm hypodense nodule present at the upper pole of the left thyroid gland (series 2, image 64). Thyroid otherwise unremarkable without mass lesion or other acute abnormality. Lymph nodes: No enlarged or pathologic adenopathy identified within the neck. Vascular: Normal intravascular enhancement seen throughout the  neck. Limited intracranial: Unremarkable. Visualized orbits: Visualized globes and orbital soft tissues demonstrate no acute finding. Patient status post ocular lens replacement on the right. Mastoids and visualized paranasal sinuses: Visualized paranasal sinuses are clear. Visualized mastoids and middle ear cavities are well pneumatized and free of fluid. Skeleton: No acute osseous abnormality. No discrete or worrisome osseous lesions. Patient is status post ACDF at C5-6. No significant dental disease identified. Upper chest: Visualized upper chest demonstrates no acute finding. Partially visualized lungs are grossly clear. Other: None. No significant inflammatory changes seen  within the right neck or elsewhere. No other findings to explain patient's symptoms identified. IMPRESSION: 1. Negative CT of the neck. No acute inflammatory changes or other abnormality identified. 2. 5 mm left thyroid nodule. This is felt to be of doubtful significance given size and patient age. No follow-up imaging recommended. (Ref: J Am Coll Radiol. 2015 Feb;12(2): 143-50). 3. Status post ACDF at C5-6. Electronically Signed   By: Rise Mu M.D.   On: 05/23/2020 20:22    Procedures Procedures (including critical care time)  Medications Ordered in ED Medications  iohexol (OMNIPAQUE) 300 MG/ML solution 75 mL (75 mLs Intravenous Contrast Given 05/23/20 1935)    ED Course  I have reviewed the triage vital signs and the nursing notes.  Pertinent labs & imaging results that were available during my care of the patient were reviewed by me and considered in my medical decision making (see chart for details).    MDM Rules/Calculators/A&P                          KLAUDIA BEIRNE is a 54 y.o. female with a past medical history significant for hypertension, hypercholesterolemia, diabetes, diabetic neuropathies, GERD, migraines, and asthma who presents with 1 week of significant right neck pain with associated swelling,  some intermittent drooling due to right facial tingling, as well as some intermittent chest pain.  Patient reports that for the last week, she is been having symptoms she is concerned.  She reports that when she is looking the mirror, the right side of her neck look like it "had a snake" in it that was rolling around.  She reports it looked like it was throbbing and was aching.  She reports the pain was under her right jaw in her right anterior lateral neck and was sore into the middle of her neck.  She reports that she had no difficulty breathing or swallowing but said that she had some tingling and numbness on her right lower face initially that caused her to have some drooling intermittently.  She reports that has resolved.  She says she is also had some chest pain in her left chest and central chest that feels like aching and sharp discomfort.  She reports no recent trauma.  She denies fevers, chills, congestion, or cough.  She denies any shortness of breath.  She denies any trauma.  She denies any other focal neurologic problems including no vision changes, numbness, tingling, or weakness of extremities that is new.  She does report she has a history of thyroid disease but does not know much information about it.  She reports the discomfort is moderate in her right neck.  On exam, lungs clear and chest was tender to palpation.  No murmur.  No abdominal tenderness.  No focal neurologic deficits on my exam.  Normal pulses in extremities.  Legs nontender nonedematous.  Midline neck nontender.  Patient did have tenderness and soreness in her right anterior neck under the jaw and in the right lower anterior neck near the thyroid gland.  Oropharyngeal exam unremarkable and uvula was midline.  She normal tongue range of motion and normal neck range of motion.  No stridor.  No carotid bruit appreciated.  Clinically I am somewhat concerned that patient may either be having a thyroid problem versus deep neck  infection versus muscle spasms in her neck and chest causing her discomfort.  We will get a CT scan to further delineate a pocket of  fluid or infection in the right neck causing her symptoms.  We will get screening labs.  She will have imaging and labs for her chest pain including a delta troponin.  If work-up is reassuring, dissipate discharge and follow-up with her PCP.  9:43 PM CT scan showed no acute abnormality in her neck.  No evidence of abscess, sialoadenitis, or other acute abnormality.  A thyroid nodule was seen which I informed patient of.  Her thyroid tests are still in process and she will follow-up with her PCP for those results.  We discussed her symptoms are likely musculoskeletal in nature and likely muscle cramps and spasms that cause the abnormality on her neck inspection.  We did not find any other concerning findings today to require antibiotics or admission at this time.  Patient agrees with plans to follow-up with her PCP for further management.  She will also use her home muscle relaxant for further management.  She no other questions or concerns and was discharged in good condition.    Final Clinical Impression(s) / ED Diagnoses Final diagnoses:  Muscle spasm  Neck pain on right side  Chest pain, unspecified type    Rx / DC Orders ED Discharge Orders    None      Clinical Impression: 1. Muscle spasm   2. Neck pain on right side   3. Chest pain, unspecified type     Disposition: Discharge  Condition: Good  I have discussed the results, Dx and Tx plan with the pt(& family if present). He/she/they expressed understanding and agree(s) with the plan. Discharge instructions discussed at great length. Strict return precautions discussed and pt &/or family have verbalized understanding of the instructions. No further questions at time of discharge.    New Prescriptions   No medications on file    Follow Up: Ranae Pila, FNP [address] [place] [address] [phone]     [facility]PART[facility] [address] [id number] [place] [address] [phone]       Lizandro Spellman, Canary Brim, MD 05/23/20 2144

## 2020-05-23 NOTE — ED Triage Notes (Addendum)
Tingling in rt side of her face x 1 week , having neck pain and jaw , felt like something in her neck was moving , throbbing  Drooling on and off for a week , last night  Handling her secreations well now  and having cp that also comes and goes for a week

## 2020-05-24 LAB — TSH: TSH: 0.67 u[IU]/mL (ref 0.350–4.500)

## 2020-05-24 LAB — T3, FREE: T3, Free: 3.1 pg/mL (ref 2.0–4.4)

## 2020-05-24 LAB — T4, FREE: Free T4: 0.66 ng/dL (ref 0.61–1.12)

## 2020-10-08 ENCOUNTER — Encounter (HOSPITAL_BASED_OUTPATIENT_CLINIC_OR_DEPARTMENT_OTHER): Payer: Self-pay

## 2020-10-08 ENCOUNTER — Other Ambulatory Visit: Payer: Self-pay

## 2020-10-08 ENCOUNTER — Emergency Department (HOSPITAL_BASED_OUTPATIENT_CLINIC_OR_DEPARTMENT_OTHER)
Admission: EM | Admit: 2020-10-08 | Discharge: 2020-10-08 | Disposition: A | Discharge status: Home / Self Care | Payer: Medicare Other | Attending: Emergency Medicine | Admitting: Emergency Medicine

## 2020-10-08 ENCOUNTER — Emergency Department (HOSPITAL_BASED_OUTPATIENT_CLINIC_OR_DEPARTMENT_OTHER): Payer: Medicare Other

## 2020-10-08 DIAGNOSIS — Z7984 Long term (current) use of oral hypoglycemic drugs: Secondary | ICD-10-CM | POA: Insufficient documentation

## 2020-10-08 DIAGNOSIS — Z79899 Other long term (current) drug therapy: Secondary | ICD-10-CM | POA: Diagnosis not present

## 2020-10-08 DIAGNOSIS — Z7982 Long term (current) use of aspirin: Secondary | ICD-10-CM | POA: Diagnosis not present

## 2020-10-08 DIAGNOSIS — R079 Chest pain, unspecified: Secondary | ICD-10-CM

## 2020-10-08 DIAGNOSIS — I10 Essential (primary) hypertension: Secondary | ICD-10-CM | POA: Insufficient documentation

## 2020-10-08 DIAGNOSIS — J4520 Mild intermittent asthma, uncomplicated: Secondary | ICD-10-CM | POA: Insufficient documentation

## 2020-10-08 DIAGNOSIS — M5442 Lumbago with sciatica, left side: Secondary | ICD-10-CM | POA: Diagnosis not present

## 2020-10-08 DIAGNOSIS — E1143 Type 2 diabetes mellitus with diabetic autonomic (poly)neuropathy: Secondary | ICD-10-CM | POA: Insufficient documentation

## 2020-10-08 DIAGNOSIS — M545 Low back pain, unspecified: Secondary | ICD-10-CM | POA: Diagnosis present

## 2020-10-08 DIAGNOSIS — Z7951 Long term (current) use of inhaled steroids: Secondary | ICD-10-CM | POA: Diagnosis not present

## 2020-10-08 DIAGNOSIS — Z87891 Personal history of nicotine dependence: Secondary | ICD-10-CM | POA: Diagnosis not present

## 2020-10-08 DIAGNOSIS — Z96653 Presence of artificial knee joint, bilateral: Secondary | ICD-10-CM | POA: Insufficient documentation

## 2020-10-08 LAB — CBC
HCT: 34.8 % — ABNORMAL LOW (ref 36.0–46.0)
Hemoglobin: 11.3 g/dL — ABNORMAL LOW (ref 12.0–15.0)
MCH: 23.6 pg — ABNORMAL LOW (ref 26.0–34.0)
MCHC: 32.5 g/dL (ref 30.0–36.0)
MCV: 72.7 fL — ABNORMAL LOW (ref 80.0–100.0)
Platelets: 217 10*3/uL (ref 150–400)
RBC: 4.79 MIL/uL (ref 3.87–5.11)
RDW: 17.4 % — ABNORMAL HIGH (ref 11.5–15.5)
WBC: 6.6 10*3/uL (ref 4.0–10.5)
nRBC: 0 % (ref 0.0–0.2)

## 2020-10-08 LAB — D-DIMER, QUANTITATIVE: D-Dimer, Quant: 0.55 ug/mL-FEU — ABNORMAL HIGH (ref 0.00–0.50)

## 2020-10-08 LAB — COMPREHENSIVE METABOLIC PANEL
ALT: 21 U/L (ref 0–44)
AST: 15 U/L (ref 15–41)
Albumin: 3.5 g/dL (ref 3.5–5.0)
Alkaline Phosphatase: 115 U/L (ref 38–126)
Anion gap: 8 (ref 5–15)
BUN: 9 mg/dL (ref 6–20)
CO2: 25 mmol/L (ref 22–32)
Calcium: 8.4 mg/dL — ABNORMAL LOW (ref 8.9–10.3)
Chloride: 107 mmol/L (ref 98–111)
Creatinine, Ser: 0.65 mg/dL (ref 0.44–1.00)
GFR, Estimated: >60 mL/min (ref >60)
Glucose, Bld: 249 mg/dL — ABNORMAL HIGH (ref 70–99)
Potassium: 3.7 mmol/L (ref 3.5–5.1)
Sodium: 140 mmol/L (ref 135–145)
Total Bilirubin: <0.1 mg/dL — ABNORMAL LOW (ref 0.3–1.2)
Total Protein: 7 g/dL (ref 6.5–8.1)

## 2020-10-08 LAB — URINALYSIS, ROUTINE W REFLEX MICROSCOPIC
Bilirubin Urine: NEGATIVE
Glucose, UA: NEGATIVE mg/dL
Hgb urine dipstick: NEGATIVE
Ketones, ur: NEGATIVE mg/dL
Leukocytes,Ua: NEGATIVE
Nitrite: NEGATIVE
Protein, ur: NEGATIVE mg/dL
Specific Gravity, Urine: 1.02 (ref 1.005–1.030)
pH: 6 (ref 5.0–8.0)

## 2020-10-08 LAB — LIPASE, BLOOD: Lipase: 46 U/L (ref 11–51)

## 2020-10-08 LAB — TROPONIN I (HIGH SENSITIVITY): Troponin I (High Sensitivity): 6 ng/L (ref <18)

## 2020-10-08 MED ORDER — DIAZEPAM 5 MG PO TABS
5.0000 mg | ORAL_TABLET | Freq: Once | ORAL | Status: AC
  Administered 2020-10-08: 5 mg via ORAL
  Filled 2020-10-08: qty 1

## 2020-10-08 MED ORDER — IOHEXOL 350 MG/ML SOLN
100.0000 mL | Freq: Once | INTRAVENOUS | Status: AC | PRN
  Administered 2020-10-08: 100 mL via INTRAVENOUS

## 2020-10-08 MED ORDER — KETOROLAC TROMETHAMINE 15 MG/ML IJ SOLN
15.0000 mg | Freq: Once | INTRAMUSCULAR | Status: AC
  Administered 2020-10-08: 15 mg via INTRAVENOUS
  Filled 2020-10-08: qty 1

## 2020-10-08 NOTE — ED Notes (Signed)
PT not in room vitals

## 2020-10-08 NOTE — ED Triage Notes (Signed)
Pt c/o back spasms and right side rib pain-denies injury-seen by PCP yesterday for same-had xrays-NAD-steady gait

## 2020-10-08 NOTE — Discharge Instructions (Signed)
You are seen in the emergency department for back spasms and right-sided chest pain.  You had blood work EKG and a CAT scan of your chest that did not show any serious causes of her symptoms.  You felt better after some medication here.  Please contact your primary care doctor for close follow-up.

## 2020-10-08 NOTE — ED Provider Notes (Signed)
Signout from Dr. Myrtis Ser.  55 year old female with history of back issues here with back spasms and right-sided rib pain.  She is pending a CT PE PE study.  Improvement with medications given already. Physical Exam  BP 137/61   Pulse 91   Temp 98.9 F (37.2 C) (Oral)   Resp (!) 26   Ht 5\' 1"  (1.549 m)   Wt 100.2 kg   SpO2 97%   BMI 41.76 kg/m   Physical Exam  ED Course/Procedures     Procedures  MDM  CT does not show any evidence of PE or other findings to account for patient's discomfort.  Will review with patient and anticipate discharge.  Patient wants to know why she has left hip pain.  Reviewed notes from PCP visit yesterday recommend pain or back pain and left radicular symptoms.  We will get an x-ray but doubt any fracture or dislocation.       , MD 10/09/20 1020

## 2020-10-08 NOTE — ED Provider Notes (Signed)
[facility] EMERGENCY DEPARTMENT Provider Note   CSN: [id number] Arrival date & time: 10/08/20  1329     History Chief Complaint  Patient presents with  . Back Pain    Jeanne Shepard is a 55 y.o. female.  Patient has been dealing with spasms for years.  They have been better until recently.  She was seen yesterday by primary care provider where she had negative plain films for any acute abnormalities but does show degenerative changes in her spine.  She also comments that the spasms radiate to her right chest and she feels something "moving around" underneath her breast tissue.  She denies any masses lumps nipple discharge.  She denies any fevers chills or trauma.  She been eating drinking normally.  Normal urination normal bowel movement.  No numbness tingling.        Past Medical History:  Diagnosis Date  . Arthritis   . Asthma   . Diabetes mellitus without complication (HCC)   . Hypertension   . Neuropathy     Patient Active Problem List   Diagnosis Date Noted  . Neck pain 12/05/2017  . Status post right knee replacement 02/28/2017  . Pulmonary nodule 01/14/2017  . Hyperthyroidism 01/30/2016  . Primary insomnia 08/26/2015  . Depression 05/30/2014  . Polyneuropathy in diabetes (HCC) 04/03/2014  . Obstructive sleep apnea 04/03/2014  . Mixed incontinence 04/03/2014  . Mild intermittent asthma, uncomplicated 04/03/2014  . Migraine without status migrainosus, not intractable 04/03/2014  . Lumbar radiculopathy 04/03/2014  . Gastro-esophageal reflux disease without esophagitis 04/03/2014  . Diabetes mellitus with neurological manifestation (HCC) 04/03/2014  . Cocaine dependence in remission (HCC) 04/03/2014  . Chronic pain syndrome 04/03/2014  . Allergic rhinitis 04/03/2014  . Anxiety disorder 04/03/2014  . Hypercholesterolemia 01/06/2013  . Chronic recurrent major depressive disorder (HCC) 01/06/2013  . Post-traumatic stress disorder 01/06/2013  .  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 SYPHILIS 01/14/2009  . OBESITY 01/14/2009  . CARPAL TUNNEL SYNDROME 01/14/2009  . HYPERTENSION 01/14/2009  . DEGENERATIVE JOINT DISEASE 01/14/2009    Past Surgical History:  Procedure Laterality Date  . CARPAL TUNNEL RELEASE Bilateral   . EYE SURGERY    . REPLACEMENT TOTAL KNEE Bilateral      OB History   No obstetric history on file.     No family history on file.  Social History   Tobacco Use  . Smoking status: Former Games developer  . Smokeless tobacco: Never Used  Vaping Use  . Vaping Use: Never used  Substance Use Topics  . Alcohol use: Not Currently  . Drug use: Not Currently    Home Medications Prior to Admission medications   Medication Sig Start Date End Date Taking? Authorizing Provider  albuterol (PROAIR HFA) 108 (90 Base) MCG/ACT inhaler Inhale into the lungs.    [provider]  amLODipine (NORVASC) 10 MG tablet TK 1 T PO ONCE DAILY 09/21/17   [provider]  aspirin EC 81 MG tablet Take by mouth. 07/01/17   [provider]  diclofenac (VOLTAREN) 75 MG EC tablet  01/14/18   [provider]  Estradiol 10 MCG TABS vaginal tablet INSERT 1 TABLET INTRAVAGINALLY QHS FOR 2 WEEKS THEN TWICE WEEKLY 11/16/17   [provider]  fluticasone furoate-vilanterol (BREO ELLIPTA) 100-25 MCG/INH AEPB INHALE 1 PUFF INTO THE LUNGS DAILY 12/15/16   [provider]  gabapentin (NEURONTIN) 600 MG tablet  11/17/17   [provider]  lisinopril (PRINIVIL,ZESTRIL) 5 MG tablet  TK 1 T PO ONCE DAILY 08/18/17   [provider]  metFORMIN (GLUCOPHAGE-XR) 500 MG 24 hr tablet TK 2 TS PO ATN 10/31/17   [provider]  methimazole (TAPAZOLE) 5 MG tablet  08/27/17   [provider]  methocarbamol (ROBAXIN) 500 MG tablet Take 1 tablet (500 mg total) by mouth every 8 (eight) hours as needed. 02/16/18   Lenda Kelp, MD  omeprazole (PRILOSEC) 20 MG capsule TK  1 C PO BID 08/29/17   [provider]  pravastatin (PRAVACHOL) 40 MG tablet  10/14/17   [provider]  tiZANidine (ZANAFLEX) 4 MG tablet TAKE 1 TABLET(4 MG) BY MOUTH EVERY 8 HOURS AS NEEDED FOR MUSCLE SPASMS 06/27/18   Hudnall, Azucena Fallen, MD  Vitamin D, Ergocalciferol, (DRISDOL) 50000 units CAPS capsule Take by mouth. 03/31/17   [provider]  zolpidem (AMBIEN) 10 MG tablet TK 1 T PO Q NIGHT PRF SLP 10/26/17   [provider]    Allergies    Tramadol and Vicodin [hydrocodone-acetaminophen]  Review of Systems   Review of Systems  Constitutional: Negative for chills and fever.  HENT: Negative for congestion and rhinorrhea.   Respiratory: Negative for cough and shortness of breath.   Cardiovascular: Positive for chest pain. Negative for palpitations.  Gastrointestinal: Negative for diarrhea, nausea and vomiting.  Genitourinary: Negative for difficulty urinating and dysuria.  Musculoskeletal: Positive for back pain. Negative for arthralgias.  Skin: Negative for rash and wound.  Neurological: Negative for light-headedness and headaches.    Physical Exam Updated Vital Signs BP (!) 145/71 (BP Location: Left Arm)   Pulse 91   Temp 98.9 F (37.2 C) (Oral)   Resp 20   Ht 5\' 1"  (1.549 m)   Wt 100.2 kg   SpO2 97%   BMI 41.76 kg/m   Physical Exam Vitals and nursing note reviewed. Exam conducted with a chaperone present.  Constitutional:      General: She is not in acute distress.    Appearance: Normal appearance.  HENT:     Head: Normocephalic and atraumatic.     Nose: No rhinorrhea.  Eyes:     General:        Right eye: No discharge.        Left eye: No discharge.     Conjunctiva/sclera: Conjunctivae normal.  Cardiovascular:     Rate and Rhythm: Normal rate and regular rhythm.  Pulmonary:     Effort: Pulmonary effort is normal. No respiratory distress.     Breath sounds: No stridor.  Chest:     Chest wall: No mass, lacerations or  tenderness.  Breasts:     Right: No inverted nipple, mass, nipple discharge or skin change.     Left: No inverted nipple, mass, nipple discharge or skin change.    Abdominal:     General: Abdomen is flat. There is no distension.     Palpations: Abdomen is soft.     Tenderness: There is no abdominal tenderness.  Musculoskeletal:        General: No tenderness or signs of injury.  Skin:    General: Skin is warm and dry.  Neurological:     General: No focal deficit present.     Mental Status: She is alert. Mental status is at baseline.     Motor: No weakness.  Psychiatric:        Mood and Affect: Mood normal.        Behavior: Behavior normal.  ED Results / Procedures / Treatments   Labs (all labs ordered are listed, but only abnormal results are displayed) Labs Reviewed  CBC - Abnormal; Notable for the following components:      Result Value   Hemoglobin 11.3 (*)    HCT 34.8 (*)    MCV 72.7 (*)    MCH 23.6 (*)    RDW 17.4 (*)    All other components within normal limits  COMPREHENSIVE METABOLIC PANEL - Abnormal; Notable for the following components:   Glucose, Bld 249 (*)    Calcium 8.4 (*)    Total Bilirubin <0.1 (*)    All other components within normal limits  D-DIMER, QUANTITATIVE - Abnormal; Notable for the following components:   D-Dimer, Quant 0.55 (*)    All other components within normal limits  LIPASE, BLOOD  URINALYSIS, ROUTINE W REFLEX MICROSCOPIC  TROPONIN I (HIGH SENSITIVITY)    EKG EKG Interpretation  Date/Time:  Wednesday October 08 2020 14:53:40 EST Ventricular Rate:  91 PR Interval:    QRS Duration: 57 QT Interval:  436 QTC Calculation: 537 R Axis:   40 Text Interpretation: Sinus rhythm Abnormal R-wave progression, early transition Nonspecific T abnormalities, diffuse leads Prolonged QT interval No significant change since prior 10/21 Confirmed by Meridee Score [phone]) on 10/08/2020 3:13:20 PM   Radiology DG Chest 2 View  Result Date:  10/08/2020 CLINICAL DATA:  Right chest pain EXAM: CHEST - 2 VIEW COMPARISON:  None. FINDINGS: The heart size and mediastinal contours are within normal limits. Both lungs are clear. No pleural effusion or pneumothorax. Two level lower cervical anterior fusion. IMPRESSION: No acute process in the chest. Electronically Signed   By: Guadlupe Spanish M.D.   On: 10/08/2020 14:49   CT Angio Chest PE W and/or Wo Contrast  Result Date: 10/08/2020 CLINICAL DATA:  Back spasms and right-sided rib pain. EXAM: CT ANGIOGRAPHY CHEST WITH CONTRAST TECHNIQUE: Multidetector CT imaging of the chest was performed using the standard protocol during bolus administration of intravenous contrast. Multiplanar CT image reconstructions and MIPs were obtained to evaluate the vascular anatomy. CONTRAST:  OMNIPAQUE IOHEXOL 350 MG/ML SOLN COMPARISON:  June 12, 2017 FINDINGS: Cardiovascular: Satisfactory opacification of the pulmonary arteries to the segmental level. No evidence of pulmonary embolism. Normal heart size. No pericardial effusion. Mediastinum/Nodes: No enlarged mediastinal, hilar, or axillary lymph nodes. Thyroid gland, trachea, and esophagus demonstrate no significant findings. Lungs/Pleura: Mild atelectasis is seen within the posteromedial aspects of the bilateral lower lobes. There is no evidence of acute infiltrate, pleural effusion or pneumothorax. Upper Abdomen: No acute abnormality. Musculoskeletal: Multilevel degenerative changes seen throughout the thoracic spine. Review of the MIP images confirms the above findings. IMPRESSION: No evidence of pulmonary embolism or other acute intrathoracic process. Electronically Signed   By: Aram Candela M.D.   On: 10/08/2020 16:08   DG Hip Unilat With Pelvis 2-3 Views Left  Result Date: 10/08/2020 CLINICAL DATA:  Left hip pain EXAM: DG HIP (WITH OR WITHOUT PELVIS) 3V LEFT COMPARISON:  None. FINDINGS: Pelvic ring is intact. Contrast material is noted within the bladder  related to recent CT. No acute fracture or dislocation is noted. No soft tissue abnormality is seen. Mild degenerative changes of the hip joints are noted bilaterally. IMPRESSION: Mild degenerative changes of the hip joints without acute abnormality. Electronically Signed   By: Alcide Clever M.D.   On: 10/08/2020 17:11    Procedures Procedures   Medications Ordered in ED Medications  ketorolac (TORADOL) 15 MG/ML  injection 15 mg (15 mg Intravenous Given 10/08/20 1435)  diazepam (VALIUM) tablet 5 mg (5 mg Oral Given 10/08/20 1427)  iohexol (OMNIPAQUE) 350 MG/ML injection 100 mL (100 mLs Intravenous Contrast Given 10/08/20 1540)    ED Course  I have reviewed the triage vital signs and the nursing notes.  Pertinent labs & imaging results that were available during my care of the patient were reviewed by me and considered in my medical decision making (see chart for details).    MDM Rules/Calculators/A&P                          55 year old female comes in with spasm in her back and right chest.  No significant abnormalities found on exam.  Will get chest x-ray will get screening biomarkers and a D-dimer.  Patient will be given Valium and anti-inflammatories.  Overall she is hemodynamically stable though mildly tachycardic.  No risk factors for PE other than age and tachycardia.  Will get EKG will get cardiac biomarkers and screening lab studies.  She has a history of diabetes is well controlled on home medications.  Pt care was handed off to on coming provider at 1500.  Complete history and physical and current plan have been communicated.  Please refer to their note for the remainder of ED care and ultimate disposition.  Pt seen in conjunction with Dr. Charm Barges  Final Clinical Impression(s) / ED Diagnoses Final diagnoses:  Left-sided low back pain with left-sided sciatica, unspecified chronicity  Nonspecific chest pain    Rx / DC Orders ED Discharge Orders    None       Sabino Donovan,  MD 10/09/20 [phone]

## 2021-04-06 ENCOUNTER — Other Ambulatory Visit: Payer: Self-pay

## 2021-04-06 ENCOUNTER — Emergency Department (HOSPITAL_BASED_OUTPATIENT_CLINIC_OR_DEPARTMENT_OTHER)
Admission: EM | Admit: 2021-04-06 | Discharge: 2021-04-06 | Disposition: A | Discharge status: Home / Self Care | Payer: Medicare Other | Attending: Emergency Medicine | Admitting: Emergency Medicine

## 2021-04-06 ENCOUNTER — Encounter (HOSPITAL_BASED_OUTPATIENT_CLINIC_OR_DEPARTMENT_OTHER): Payer: Self-pay | Admitting: *Deleted

## 2021-04-06 ENCOUNTER — Emergency Department (HOSPITAL_BASED_OUTPATIENT_CLINIC_OR_DEPARTMENT_OTHER): Payer: Medicare Other

## 2021-04-06 DIAGNOSIS — Z87891 Personal history of nicotine dependence: Secondary | ICD-10-CM | POA: Insufficient documentation

## 2021-04-06 DIAGNOSIS — R0789 Other chest pain: Secondary | ICD-10-CM | POA: Diagnosis not present

## 2021-04-06 DIAGNOSIS — Z7984 Long term (current) use of oral hypoglycemic drugs: Secondary | ICD-10-CM | POA: Diagnosis not present

## 2021-04-06 DIAGNOSIS — E1140 Type 2 diabetes mellitus with diabetic neuropathy, unspecified: Secondary | ICD-10-CM | POA: Diagnosis not present

## 2021-04-06 DIAGNOSIS — Z96651 Presence of right artificial knee joint: Secondary | ICD-10-CM | POA: Diagnosis not present

## 2021-04-06 DIAGNOSIS — Z7951 Long term (current) use of inhaled steroids: Secondary | ICD-10-CM | POA: Insufficient documentation

## 2021-04-06 DIAGNOSIS — J4520 Mild intermittent asthma, uncomplicated: Secondary | ICD-10-CM | POA: Diagnosis not present

## 2021-04-06 DIAGNOSIS — Z7982 Long term (current) use of aspirin: Secondary | ICD-10-CM | POA: Diagnosis not present

## 2021-04-06 DIAGNOSIS — Z79899 Other long term (current) drug therapy: Secondary | ICD-10-CM | POA: Insufficient documentation

## 2021-04-06 DIAGNOSIS — I10 Essential (primary) hypertension: Secondary | ICD-10-CM | POA: Diagnosis not present

## 2021-04-06 DIAGNOSIS — R079 Chest pain, unspecified: Secondary | ICD-10-CM | POA: Diagnosis present

## 2021-04-06 LAB — CBC WITH DIFFERENTIAL/PLATELET
Abs Immature Granulocytes: 0.02 10*3/uL (ref 0.00–0.07)
Basophils Absolute: 0 10*3/uL (ref 0.0–0.1)
Basophils Relative: 0 %
Eosinophils Absolute: 0.1 10*3/uL (ref 0.0–0.5)
Eosinophils Relative: 1 %
HCT: 33.3 % — ABNORMAL LOW (ref 36.0–46.0)
Hemoglobin: 10.7 g/dL — ABNORMAL LOW (ref 12.0–15.0)
Immature Granulocytes: 0 %
Lymphocytes Relative: 30 %
Lymphs Abs: 2.1 10*3/uL (ref 0.7–4.0)
MCH: 23.5 pg — ABNORMAL LOW (ref 26.0–34.0)
MCHC: 32.1 g/dL (ref 30.0–36.0)
MCV: 73 fL — ABNORMAL LOW (ref 80.0–100.0)
Monocytes Absolute: 0.5 10*3/uL (ref 0.1–1.0)
Monocytes Relative: 7 %
Neutro Abs: 4.3 10*3/uL (ref 1.7–7.7)
Neutrophils Relative %: 62 %
Platelets: 202 10*3/uL (ref 150–400)
RBC: 4.56 MIL/uL (ref 3.87–5.11)
RDW: 17.2 % — ABNORMAL HIGH (ref 11.5–15.5)
WBC: 6.9 10*3/uL (ref 4.0–10.5)
nRBC: 0 % (ref 0.0–0.2)

## 2021-04-06 LAB — BASIC METABOLIC PANEL
Anion gap: 6 (ref 5–15)
BUN: 14 mg/dL (ref 6–20)
CO2: 25 mmol/L (ref 22–32)
Calcium: 8.8 mg/dL — ABNORMAL LOW (ref 8.9–10.3)
Chloride: 109 mmol/L (ref 98–111)
Creatinine, Ser: 0.69 mg/dL (ref 0.44–1.00)
GFR, Estimated: >60 mL/min (ref >60)
Glucose, Bld: 148 mg/dL — ABNORMAL HIGH (ref 70–99)
Potassium: 3.7 mmol/L (ref 3.5–5.1)
Sodium: 140 mmol/L (ref 135–145)

## 2021-04-06 LAB — TROPONIN I (HIGH SENSITIVITY): Troponin I (High Sensitivity): 4 ng/L (ref <18)

## 2021-04-06 MED ORDER — ASPIRIN 81 MG PO CHEW
324.0000 mg | CHEWABLE_TABLET | Freq: Once | ORAL | Status: AC
  Administered 2021-04-06: 243 mg via ORAL
  Filled 2021-04-06: qty 4

## 2021-04-06 NOTE — ED Triage Notes (Signed)
Soreness in the center of her chest x 2 days. Denies cough.

## 2021-04-06 NOTE — ED Provider Notes (Signed)
[facility] EMERGENCY DEPARTMENT Provider Note   CSN: [id number] Arrival date & time: 04/06/21  1948     History Chief Complaint  Patient presents with   Chest Pain    Jeanne Shepard is a 55 y.o. female.  55 yo F with a chief complaints of right-sided chest pain.  Has been going on for a couple days.  Denies cough denies fever denies trauma.  Patient feels like the pain is sharp is worse with deep breathing palpation and twisting.  She denies history of PE or DVT denies hemoptysis denies unilateral lower extremity edema denies recent surgery immobilization or hospitalization denies estrogen use denies history of cancers.  She has a history of hypertension hyperlipidemia and diabetes.  She denies smoking.  Family without history of cardiac disease.  The history is provided by the patient.  Chest Pain Pain location:  R chest Pain quality: sharp and shooting   Pain radiates to:  Does not radiate Pain severity:  Moderate Onset quality:  Gradual Duration:  2 days Timing:  Constant Progression:  Worsening Chronicity:  New Relieved by:  Nothing Worsened by:  Nothing Ineffective treatments:  None tried Associated symptoms: no dizziness, no fever, no headache, no nausea, no palpitations, no shortness of breath and no vomiting       Past Medical History:  Diagnosis Date   Arthritis    Asthma    Diabetes mellitus without complication (HCC)    Hypertension    Neuropathy     Patient Active Problem List   Diagnosis Date Noted   Neck pain 12/05/2017   Status post right knee replacement 02/28/2017   Pulmonary nodule 01/14/2017   Hyperthyroidism 01/30/2016   Primary insomnia 08/26/2015   Depression 05/30/2014   Polyneuropathy in diabetes (HCC) 04/03/2014   Obstructive sleep apnea 04/03/2014   Mixed incontinence 04/03/2014   Mild intermittent asthma, uncomplicated 04/03/2014   Migraine without status migrainosus, not intractable 04/03/2014   Lumbar radiculopathy  04/03/2014   Gastro-esophageal reflux disease without esophagitis 04/03/2014   Diabetes mellitus with neurological manifestation (HCC) 04/03/2014   Cocaine dependence in remission (HCC) 04/03/2014   Chronic pain syndrome 04/03/2014   Allergic rhinitis 04/03/2014   Anxiety disorder 04/03/2014   Hypercholesterolemia 01/06/2013   Chronic recurrent major depressive disorder (HCC) 01/06/2013   Post-traumatic stress disorder 01/06/2013   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     No family history on file.  Social History   Tobacco Use   Smoking status: Former   Smokeless tobacco: Never  Building services engineer Use: Never used  Substance Use Topics   Alcohol use: Not Currently   Drug use: Not Currently    Home Medications Prior to Admission medications   Medication Sig Start Date End Date Taking? Authorizing Provider  amLODipine (NORVASC) 10 MG tablet TK 1 T PO ONCE DAILY 09/21/17  Yes [provider]  aspirin EC 81 MG tablet Take by mouth. 07/01/17  Yes [provider]  diclofenac (VOLTAREN) 75 MG EC tablet  01/14/18  Yes [provider]  lisinopril (PRINIVIL,ZESTRIL) 5 MG tablet TK  1 T PO ONCE DAILY 08/18/17  Yes [provider]  omeprazole (PRILOSEC) 20 MG capsule TK 1 C PO BID 08/29/17  Yes [provider]  pravastatin (PRAVACHOL) 40 MG tablet  10/14/17  Yes [provider]  tiZANidine (ZANAFLEX) 4 MG tablet TAKE 1 TABLET(4 MG) BY MOUTH EVERY 8 HOURS AS NEEDED FOR MUSCLE SPASMS 06/27/18  Yes Hudnall, Azucena Fallen, MD  Vitamin D, Ergocalciferol, (DRISDOL) 50000 units CAPS capsule Take  by mouth. 03/31/17  Yes [provider]  zolpidem (AMBIEN) 10 MG tablet TK 1 T PO Q NIGHT PRF SLP 10/26/17  Yes [provider]  albuterol (VENTOLIN HFA) 108 (90 Base) MCG/ACT inhaler Inhale into the lungs.    [provider]  Estradiol 10 MCG TABS vaginal tablet INSERT 1 TABLET INTRAVAGINALLY QHS FOR 2 WEEKS THEN TWICE WEEKLY 11/16/17   [provider]  fluticasone furoate-vilanterol (BREO ELLIPTA) 100-25 MCG/INH AEPB INHALE 1 PUFF INTO THE LUNGS DAILY 12/15/16   [provider]  gabapentin (NEURONTIN) 600 MG tablet  11/17/17   [provider]  metFORMIN (GLUCOPHAGE-XR) 500 MG 24 hr tablet TK 2 TS PO ATN 10/31/17   [provider]  methimazole (TAPAZOLE) 5 MG tablet  08/27/17   [provider]  methocarbamol (ROBAXIN) 500 MG tablet Take 1 tablet (500 mg total) by mouth every 8 (eight) hours as needed. 02/16/18   Lenda Kelp, MD    Allergies    Tramadol and Vicodin [hydrocodone-acetaminophen]  Review of Systems   Review of Systems  Constitutional:  Negative for chills and fever.  HENT:  Negative for congestion and rhinorrhea.   Eyes:  Negative for redness and visual disturbance.  Respiratory:  Negative for shortness of breath and wheezing.   Cardiovascular:  Positive for chest pain. Negative for palpitations.  Gastrointestinal:  Negative for nausea and vomiting.  Genitourinary:  Negative for dysuria and urgency.  Musculoskeletal:  Negative for arthralgias and myalgias.  Skin:  Negative for pallor and wound.  Neurological:  Negative for dizziness and headaches.   Physical Exam Updated Vital Signs BP (!) 156/85 (BP Location: Right Arm)   Pulse 89   Temp 98.2 F (36.8 C) (Oral)   Resp 20   Ht 5\' 1"  (1.549 m)   Wt 98.9 kg   SpO2 98%   BMI 41.19 kg/m   Physical Exam Vitals and nursing note reviewed.  Constitutional:      General: She is not in acute distress.    Appearance: She is well-developed. She is not  diaphoretic.     Comments: BMI 41  HENT:     Head: Normocephalic and atraumatic.  Eyes:     Pupils: Pupils are equal, round, and reactive to light.  Cardiovascular:     Rate and Rhythm: Normal rate and regular rhythm.     Heart sounds: No murmur heard.   No friction rub. No gallop.  Pulmonary:     Effort: Pulmonary effort is normal.     Breath sounds: No wheezing or rales.  Chest:     Chest wall: Tenderness (Pain with palpation along the right sternal border reproduces the patient's symptoms.) present.  Abdominal:     General: There is no distension.     Palpations: Abdomen is soft.     Tenderness: There is no abdominal tenderness.  Musculoskeletal:        General: No tenderness.     Cervical back: Normal range of motion and neck supple.  Skin:  General: Skin is warm and dry.  Neurological:     Mental Status: She is alert and oriented to person, place, and time.  Psychiatric:        Behavior: Behavior normal.    ED Results / Procedures / Treatments   Labs (all labs ordered are listed, but only abnormal results are displayed) Labs Reviewed  CBC WITH DIFFERENTIAL/PLATELET - Abnormal; Notable for the following components:      Result Value   Hemoglobin 10.7 (*)    HCT 33.3 (*)    MCV 73.0 (*)    MCH 23.5 (*)    RDW 17.2 (*)    All other components within normal limits  BASIC METABOLIC PANEL - Abnormal; Notable for the following components:   Glucose, Bld 148 (*)    Calcium 8.8 (*)    All other components within normal limits  TROPONIN I (HIGH SENSITIVITY)    EKG EKG Interpretation  Date/Time:  Monday April 06 2021 20:02:19 EDT Ventricular Rate:  88 PR Interval:  144 QRS Duration: 58 QT Interval:  344 QTC Calculation: 416 R Axis:   46 Text Interpretation: Normal sinus rhythm Normal ECG No significant change since last tracing Confirmed by Melene Plan [phone]) on 04/06/2021 8:21:12 PM  Radiology DG Chest Port 1 View  Result Date: 04/06/2021 CLINICAL DATA:   Chest pain EXAM: PORTABLE CHEST 1 VIEW COMPARISON:  October 08, 2020 FINDINGS: The heart size and mediastinal contours are within normal limits. Both lungs are clear. No visible pleural effusion or pneumothorax. The visualized skeletal structures are unremarkable. Partially visualized anterior cervical fusion hardware. IMPRESSION: No acute cardiopulmonary disease. Electronically Signed   By: Maudry Mayhew M.D.   On: 04/06/2021 20:38    Procedures Procedures   Medications Ordered in ED Medications  aspirin chewable tablet 324 mg (243 mg Oral Given 04/06/21 2037)    ED Course  I have reviewed the triage vital signs and the nursing notes.  Pertinent labs & imaging results that were available during my care of the patient were reviewed by me and considered in my medical decision making (see chart for details).    MDM Rules/Calculators/A&P                           55 yo F with a chief complaints of right-sided chest pain.  This is atypical and reproduced on exam.  I feel is completely atypical of PE and no further work-up is warranted.  We will get a chest x-ray 1 round of blood work.  She has had symptoms for greater than 48 hours and not feel a delta is warranted.  Lab work is resulted and is unremarkable.  Chest x-ray viewed by me without focal infiltrate or pneumothorax.  Most likely musculoskeletal.  Will discharge home.  9:31 PM:  I have discussed the diagnosis/risks/treatment options with the patient and believe the pt to be eligible for discharge home to follow-up with PCP. We also discussed returning to the ED immediately if new or worsening sx occur. We discussed the sx which are most concerning (e.g., sudden worsening pain, fever, inability to tolerate by mouth) that necessitate immediate return. Medications administered to the patient during their visit and any new prescriptions provided to the patient are listed below.  Medications given during this visit Medications  aspirin  chewable tablet 324 mg (243 mg Oral Given 04/06/21 2037)     The patient appears reasonably screen and/or stabilized for discharge and  I doubt any other medical condition or other [facility] requiring further screening, evaluation, or treatment in the ED at this time prior to discharge.   Final Clinical Impression(s) / ED Diagnoses Final diagnoses:  Chest wall pain    Rx / DC Orders ED Discharge Orders     None        Melene Plan, DO 04/06/21 2131

## 2021-04-06 NOTE — Discharge Instructions (Addendum)
Take 4 over the counter ibuprofen tablets 3 times a day or 2 over-the-counter naproxen tablets twice a day for pain. Also take tylenol 1000mg(2 extra strength) four times a day.    

## 2021-05-20 ENCOUNTER — Encounter (HOSPITAL_BASED_OUTPATIENT_CLINIC_OR_DEPARTMENT_OTHER): Payer: Self-pay

## 2021-05-20 ENCOUNTER — Emergency Department (HOSPITAL_BASED_OUTPATIENT_CLINIC_OR_DEPARTMENT_OTHER)
Admission: EM | Admit: 2021-05-20 | Discharge: 2021-05-20 | Disposition: A | Discharge status: Home / Self Care | Payer: Medicare Other | Attending: Emergency Medicine | Admitting: Emergency Medicine

## 2021-05-20 ENCOUNTER — Emergency Department (HOSPITAL_BASED_OUTPATIENT_CLINIC_OR_DEPARTMENT_OTHER): Payer: Medicare Other

## 2021-05-20 ENCOUNTER — Other Ambulatory Visit: Payer: Self-pay

## 2021-05-20 DIAGNOSIS — Z87891 Personal history of nicotine dependence: Secondary | ICD-10-CM | POA: Insufficient documentation

## 2021-05-20 DIAGNOSIS — Z96653 Presence of artificial knee joint, bilateral: Secondary | ICD-10-CM | POA: Diagnosis not present

## 2021-05-20 DIAGNOSIS — Z7984 Long term (current) use of oral hypoglycemic drugs: Secondary | ICD-10-CM | POA: Insufficient documentation

## 2021-05-20 DIAGNOSIS — J4520 Mild intermittent asthma, uncomplicated: Secondary | ICD-10-CM | POA: Insufficient documentation

## 2021-05-20 DIAGNOSIS — Z79899 Other long term (current) drug therapy: Secondary | ICD-10-CM | POA: Diagnosis not present

## 2021-05-20 DIAGNOSIS — Z7982 Long term (current) use of aspirin: Secondary | ICD-10-CM | POA: Diagnosis not present

## 2021-05-20 DIAGNOSIS — Z7951 Long term (current) use of inhaled steroids: Secondary | ICD-10-CM | POA: Insufficient documentation

## 2021-05-20 DIAGNOSIS — J069 Acute upper respiratory infection, unspecified: Secondary | ICD-10-CM | POA: Insufficient documentation

## 2021-05-20 DIAGNOSIS — J3489 Other specified disorders of nose and nasal sinuses: Secondary | ICD-10-CM | POA: Diagnosis not present

## 2021-05-20 DIAGNOSIS — Z20822 Contact with and (suspected) exposure to covid-19: Secondary | ICD-10-CM | POA: Insufficient documentation

## 2021-05-20 DIAGNOSIS — I10 Essential (primary) hypertension: Secondary | ICD-10-CM | POA: Diagnosis not present

## 2021-05-20 DIAGNOSIS — E119 Type 2 diabetes mellitus without complications: Secondary | ICD-10-CM | POA: Diagnosis not present

## 2021-05-20 DIAGNOSIS — R059 Cough, unspecified: Secondary | ICD-10-CM | POA: Diagnosis present

## 2021-05-20 LAB — RESP PANEL BY RT-PCR (FLU A&B, COVID) ARPGX2
Influenza A by PCR: NEGATIVE
Influenza B by PCR: NEGATIVE
SARS Coronavirus 2 by RT PCR: NEGATIVE

## 2021-05-20 NOTE — Discharge Instructions (Addendum)
You are seen today for evaluation of your cough.  COVID and flu swabs were both negative.  Your chest x-ray showed no evidence of pneumonia, physical exam backed up this finding.  Feel that your symptoms are consistent with viral upper respiratory infection, no antibiotics are indicated at this time.  Manage her symptoms with supportive care including adequate hydration, humidifier at night, nasal saline, Mucinex, Flonase, Zyrtec over-the-counter as well as Tylenol for fever management.  Follow-up with your primary care provider in the next few days for continued symptom management.  Return if symptoms worsen or development of significant shortness of breath.

## 2021-05-20 NOTE — ED Provider Notes (Signed)
[facility] EMERGENCY DEPARTMENT Provider Note   CSN: [id number] Arrival date & time: 05/20/21  0932     History Chief Complaint  Patient presents with   Cough    Jeanne Shepard is a 55 y.o. female.  Patient with history of diabetes presents today with chief complaint of cough.  She states that the symptoms began on Sunday, began with congestion and rhinorrhea.  Was worse last night with development of cough productive of green sputum and green mucus from her nose.  Also states that she felt warm and diaphoretic last night but did not check her temperature.  Has some chest tenderness during coughing episodes with some shortness of breath.  Had Robitussin without success, has not tried any other interventions for symptoms. Endorses 1 episode of nonbloody emesis last night, no diarrhea    The history is provided by the patient. No language interpreter was used.  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/28/2017   Pulmonary nodule 01/14/2017   Hyperthyroidism 01/30/2016   Primary insomnia 08/26/2015   Depression 05/30/2014   Polyneuropathy in diabetes (HCC) 04/03/2014   Obstructive sleep apnea 04/03/2014   Mixed incontinence 04/03/2014   Mild intermittent asthma, uncomplicated 04/03/2014   Migraine without status migrainosus, not intractable 04/03/2014   Lumbar radiculopathy 04/03/2014   Gastro-esophageal reflux disease without esophagitis 04/03/2014   Diabetes mellitus with neurological manifestation (HCC) 04/03/2014   Cocaine dependence in remission (HCC) 04/03/2014   Chronic  pain syndrome 04/03/2014   Allergic rhinitis 04/03/2014   Anxiety disorder 04/03/2014   Hypercholesterolemia 01/06/2013   Chronic recurrent major depressive disorder (HCC) 01/06/2013   Post-traumatic stress disorder 01/06/2013   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     History reviewed. No pertinent family history.  Social History   Tobacco Use   Smoking status: Former   Smokeless tobacco: Never  Building services engineer Use: Never used  Substance Use Topics   Alcohol use: Not Currently   Drug use: Not Currently    Home Medications Prior to Admission medications   Medication Sig Start Date End Date Taking? Authorizing Provider  albuterol (VENTOLIN HFA) 108 (90 Base) MCG/ACT inhaler Inhale into the lungs.    [provider]  amLODipine (NORVASC) 10 MG tablet TK 1 T PO ONCE DAILY 09/21/17   [provider]  aspirin EC 81 MG tablet Take by mouth. 07/01/17   [provider]  diclofenac (VOLTAREN) 75 MG EC tablet  01/14/18   [provider]  Estradiol 10 MCG TABS vaginal tablet INSERT 1 TABLET INTRAVAGINALLY QHS FOR 2 WEEKS THEN TWICE WEEKLY 11/16/17   [provider]  fluticasone furoate-vilanterol (BREO ELLIPTA) 100-25 MCG/INH AEPB INHALE 1 PUFF INTO THE LUNGS DAILY 12/15/16   [provider]  gabapentin (NEURONTIN) 600 MG tablet  11/17/17   [provider]  lisinopril (PRINIVIL,ZESTRIL) 5 MG tablet TK 1 T PO ONCE DAILY 08/18/17   [provider]  metFORMIN (GLUCOPHAGE-XR) 500 MG 24 hr tablet TK 2 TS PO ATN 10/31/17   [provider]  methimazole (TAPAZOLE)  5 MG tablet  08/27/17   [provider]  methocarbamol (ROBAXIN) 500 MG tablet Take 1 tablet (500 mg total) by mouth every 8 (eight) hours as needed. 02/16/18   Lenda Kelp, MD  omeprazole (PRILOSEC) 20 MG capsule TK 1 C PO BID 08/29/17   [provider]  pravastatin (PRAVACHOL) 40 MG tablet  10/14/17   [provider]  tiZANidine (ZANAFLEX) 4 MG tablet TAKE 1 TABLET(4 MG) BY MOUTH EVERY 8 HOURS AS NEEDED FOR MUSCLE SPASMS 06/27/18   Hudnall, Azucena Fallen, MD  Vitamin D, Ergocalciferol, (DRISDOL) 50000 units CAPS capsule Take by mouth. 03/31/17   [provider]  zolpidem (AMBIEN) 10 MG tablet TK 1 T PO Q NIGHT PRF SLP 10/26/17   [provider]    Allergies    Tramadol and Vicodin [hydrocodone-acetaminophen]  Review of Systems   Review of Systems  Constitutional:  Negative for chills, fatigue and fever.  HENT:  Positive for congestion, postnasal drip, rhinorrhea, sinus pressure, sinus pain, sneezing and sore throat. Negative for ear discharge, ear pain, facial swelling and mouth sores.   Eyes:  Negative for redness.  Respiratory:  Positive for cough. Negative for choking, shortness of breath, wheezing and stridor.   Cardiovascular:  Negative for leg swelling.  Gastrointestinal:  Negative for abdominal distention, abdominal pain, constipation, diarrhea, nausea and vomiting.  Genitourinary:  Negative for difficulty urinating.  Musculoskeletal:  Negative for neck pain and neck stiffness.  Skin:  Negative for wound.  Neurological:  Negative for dizziness, tremors, seizures, syncope, facial asymmetry, speech difficulty, weakness, light-headedness, numbness and headaches.  Psychiatric/Behavioral:  Negative for confusion and decreased concentration.   All other systems reviewed and are negative.  Physical Exam Updated Vital Signs BP (!) 150/67 (BP Location: Right Arm)   Pulse 96   Temp 98.3 F (36.8 C) (Oral)   Resp 18   Ht 5\' 1"  (1.549 m)   Wt 95.3  kg   SpO2 99%   BMI 39.68 kg/m   Physical Exam Vitals and nursing note reviewed.  Constitutional:      General: She is not in acute distress.    Appearance: Normal appearance. She is normal weight. She is not ill-appearing, toxic-appearing or diaphoretic.  HENT:     Head: Normocephalic and atraumatic.     Right Ear: Tympanic membrane, ear canal and external ear normal.     Left Ear: Tympanic membrane, ear canal and external ear normal.     Nose: Congestion present.     Mouth/Throat:     Lips: Pink.     Mouth: Mucous membranes are moist.     Tongue: No lesions. Tongue does not deviate from midline.     Palate: No mass and lesions.     Pharynx: Uvula midline. Posterior oropharyngeal erythema present. No pharyngeal swelling, oropharyngeal exudate or uvula swelling.     Tonsils: 2+ on the right. 2+ on the left.  Eyes:     Conjunctiva/sclera: Conjunctivae normal.     Pupils: Pupils are equal, round, and reactive to  light.  Cardiovascular:     Rate and Rhythm: Normal rate and regular rhythm.     Pulses: Normal pulses.     Heart sounds: Normal heart sounds.  Pulmonary:     Effort: Pulmonary effort is normal. No respiratory distress.     Breath sounds: Normal breath sounds. No stridor. No wheezing, rhonchi or rales.     Comments: Lungs clear to auscultation in all fields Chest:     Chest wall: Tenderness present.  Abdominal:     General: Abdomen is flat. Bowel sounds are normal.     Palpations: Abdomen is soft.  Musculoskeletal:        General: Normal range of motion.     Cervical back: Normal range of motion and neck supple.  Skin:    General: Skin is warm and dry.  Neurological:     General: No focal deficit present.     Mental Status: She is alert.  Psychiatric:        Mood and Affect: Mood normal.        Behavior: Behavior normal.    ED Results / Procedures / Treatments   Labs (all labs ordered are listed, but only abnormal results are displayed) Labs Reviewed  RESP  PANEL BY RT-PCR (FLU A&B, COVID) [id number]    EKG None  Radiology DG Chest Portable 1 View  Result Date: 05/20/2021 CLINICAL DATA:  Cough, chest pain EXAM: PORTABLE CHEST 1 VIEW COMPARISON:  04/06/2021 FINDINGS: The heart size and mediastinal contours are within normal limits. No focal airspace consolidation, pleural effusion, or pneumothorax. The visualized skeletal structures are unremarkable. IMPRESSION: No active disease. Electronically Signed   By: Duanne Guess D.O.   On: 05/20/2021 10:07    Procedures Procedures   Medications Ordered in ED Medications - No data to display  ED Course  I have reviewed the triage vital signs and the nursing notes.  Pertinent labs & imaging results that were available during my care of the patient were reviewed by me and considered in my medical decision making (see chart for details).    MDM Rules/Calculators/A&P                         Pt flu and COVID negative. CXR negative for acute infiltrate. Lungs clear in all fields, patient's oxygen saturation remains 100% on room air throughout visit.  She is afebrile, nontoxic-appearing, and in no acute distress.  Low suspicion for pneumonia at this time. Patients symptoms are consistent with URI, likely viral etiology. Discussed that antibiotics are not indicated for viral infections. Pt will be discharged with symptomatic treatment, thoroughly discussed supportive measures including humidifier, nasal saline, Tylenol, Mucinex, Flonase, Zyrtec over-the-counter.  Verbalizes understanding and is agreeable with plan. Pt is hemodynamically stable & in NAD prior to dc.   Final Clinical Impression(s) / ED Diagnoses Final diagnoses:  Viral upper respiratory tract infection    Rx / DC Orders ED Discharge Orders     None     An After Visit Summary was printed and given to the patient.    Vear Clock 05/20/21 1202    Melene Plan, DO 05/20/21 1239

## 2021-05-20 NOTE — ED Triage Notes (Addendum)
Pt c/o bodyaches, chills, coughing, congestion, sore throat, vomiting, diarrhea since Sunday. Chest pain with coughing & shortness of breath. NAD during triage

## 2021-06-27 ENCOUNTER — Emergency Department (HOSPITAL_BASED_OUTPATIENT_CLINIC_OR_DEPARTMENT_OTHER)
Admission: EM | Admit: 2021-06-27 | Discharge: 2021-06-27 | Disposition: A | Discharge status: Home / Self Care | Payer: Medicare Other | Attending: Emergency Medicine | Admitting: Emergency Medicine

## 2021-06-27 ENCOUNTER — Encounter (HOSPITAL_BASED_OUTPATIENT_CLINIC_OR_DEPARTMENT_OTHER): Payer: Self-pay | Admitting: *Deleted

## 2021-06-27 ENCOUNTER — Emergency Department (HOSPITAL_BASED_OUTPATIENT_CLINIC_OR_DEPARTMENT_OTHER): Payer: Medicare Other

## 2021-06-27 ENCOUNTER — Other Ambulatory Visit: Payer: Self-pay

## 2021-06-27 DIAGNOSIS — Z79899 Other long term (current) drug therapy: Secondary | ICD-10-CM | POA: Diagnosis not present

## 2021-06-27 DIAGNOSIS — S39012A Strain of muscle, fascia and tendon of lower back, initial encounter: Secondary | ICD-10-CM | POA: Diagnosis not present

## 2021-06-27 DIAGNOSIS — X58XXXA Exposure to other specified factors, initial encounter: Secondary | ICD-10-CM | POA: Diagnosis not present

## 2021-06-27 DIAGNOSIS — J45909 Unspecified asthma, uncomplicated: Secondary | ICD-10-CM | POA: Insufficient documentation

## 2021-06-27 DIAGNOSIS — Z87891 Personal history of nicotine dependence: Secondary | ICD-10-CM | POA: Diagnosis not present

## 2021-06-27 DIAGNOSIS — Z96653 Presence of artificial knee joint, bilateral: Secondary | ICD-10-CM | POA: Diagnosis not present

## 2021-06-27 DIAGNOSIS — K219 Gastro-esophageal reflux disease without esophagitis: Secondary | ICD-10-CM | POA: Insufficient documentation

## 2021-06-27 DIAGNOSIS — E1165 Type 2 diabetes mellitus with hyperglycemia: Secondary | ICD-10-CM | POA: Diagnosis not present

## 2021-06-27 DIAGNOSIS — I10 Essential (primary) hypertension: Secondary | ICD-10-CM | POA: Diagnosis not present

## 2021-06-27 DIAGNOSIS — S3992XA Unspecified injury of lower back, initial encounter: Secondary | ICD-10-CM | POA: Diagnosis present

## 2021-06-27 DIAGNOSIS — R1110 Vomiting, unspecified: Secondary | ICD-10-CM | POA: Insufficient documentation

## 2021-06-27 DIAGNOSIS — R109 Unspecified abdominal pain: Secondary | ICD-10-CM | POA: Insufficient documentation

## 2021-06-27 DIAGNOSIS — Z7982 Long term (current) use of aspirin: Secondary | ICD-10-CM | POA: Insufficient documentation

## 2021-06-27 DIAGNOSIS — E1149 Type 2 diabetes mellitus with other diabetic neurological complication: Secondary | ICD-10-CM | POA: Diagnosis not present

## 2021-06-27 DIAGNOSIS — Z7951 Long term (current) use of inhaled steroids: Secondary | ICD-10-CM | POA: Diagnosis not present

## 2021-06-27 DIAGNOSIS — Z7984 Long term (current) use of oral hypoglycemic drugs: Secondary | ICD-10-CM | POA: Insufficient documentation

## 2021-06-27 LAB — CBC WITH DIFFERENTIAL/PLATELET
Abs Immature Granulocytes: 0.02 10*3/uL (ref 0.00–0.07)
Basophils Absolute: 0 10*3/uL (ref 0.0–0.1)
Basophils Relative: 0 %
Eosinophils Absolute: 0.1 10*3/uL (ref 0.0–0.5)
Eosinophils Relative: 2 %
HCT: 33.2 % — ABNORMAL LOW (ref 36.0–46.0)
Hemoglobin: 10.9 g/dL — ABNORMAL LOW (ref 12.0–15.0)
Immature Granulocytes: 0 %
Lymphocytes Relative: 24 %
Lymphs Abs: 1.5 10*3/uL (ref 0.7–4.0)
MCH: 23.8 pg — ABNORMAL LOW (ref 26.0–34.0)
MCHC: 32.8 g/dL (ref 30.0–36.0)
MCV: 72.5 fL — ABNORMAL LOW (ref 80.0–100.0)
Monocytes Absolute: 0.5 10*3/uL (ref 0.1–1.0)
Monocytes Relative: 8 %
Neutro Abs: 4 10*3/uL (ref 1.7–7.7)
Neutrophils Relative %: 66 %
Platelets: 173 10*3/uL (ref 150–400)
RBC: 4.58 MIL/uL (ref 3.87–5.11)
RDW: 17.6 % — ABNORMAL HIGH (ref 11.5–15.5)
WBC: 6.1 10*3/uL (ref 4.0–10.5)
nRBC: 0 % (ref 0.0–0.2)

## 2021-06-27 LAB — COMPREHENSIVE METABOLIC PANEL
ALT: 25 U/L (ref 0–44)
AST: 15 U/L (ref 15–41)
Albumin: 3.7 g/dL (ref 3.5–5.0)
Alkaline Phosphatase: 95 U/L (ref 38–126)
Anion gap: 8 (ref 5–15)
BUN: 10 mg/dL (ref 6–20)
CO2: 25 mmol/L (ref 22–32)
Calcium: 8.7 mg/dL — ABNORMAL LOW (ref 8.9–10.3)
Chloride: 106 mmol/L (ref 98–111)
Creatinine, Ser: 0.59 mg/dL (ref 0.44–1.00)
GFR, Estimated: >60 mL/min (ref >60)
Glucose, Bld: 136 mg/dL — ABNORMAL HIGH (ref 70–99)
Potassium: 3.6 mmol/L (ref 3.5–5.1)
Sodium: 139 mmol/L (ref 135–145)
Total Bilirubin: 0.2 mg/dL — ABNORMAL LOW (ref 0.3–1.2)
Total Protein: 7 g/dL (ref 6.5–8.1)

## 2021-06-27 LAB — URINALYSIS, ROUTINE W REFLEX MICROSCOPIC
Bilirubin Urine: NEGATIVE
Glucose, UA: NEGATIVE mg/dL
Hgb urine dipstick: NEGATIVE
Ketones, ur: NEGATIVE mg/dL
Leukocytes,Ua: NEGATIVE
Nitrite: NEGATIVE
Protein, ur: NEGATIVE mg/dL
Specific Gravity, Urine: 1.015 (ref 1.005–1.030)
pH: 6 (ref 5.0–8.0)

## 2021-06-27 LAB — LIPASE, BLOOD: Lipase: 43 U/L (ref 11–51)

## 2021-06-27 MED ORDER — FENTANYL CITRATE PF 50 MCG/ML IJ SOSY
50.0000 ug | PREFILLED_SYRINGE | Freq: Once | INTRAMUSCULAR | Status: AC
  Administered 2021-06-27: 50 ug via INTRAVENOUS
  Filled 2021-06-27: qty 1

## 2021-06-27 MED ORDER — LIDOCAINE 5 % EX PTCH: 1.0000 | MEDICATED_PATCH | CUTANEOUS | 0 refills | Status: AC

## 2021-06-27 MED ORDER — NAPROXEN 375 MG PO TABS: 375.0000 mg | ORAL_TABLET | Freq: Two times a day (BID) | ORAL | 0 refills | Status: AC

## 2021-06-27 MED ORDER — ONDANSETRON HCL 4 MG/2ML IJ SOLN
4.0000 mg | Freq: Once | INTRAMUSCULAR | Status: AC
  Administered 2021-06-27: 4 mg via INTRAVENOUS
  Filled 2021-06-27: qty 2

## 2021-06-27 NOTE — ED Provider Notes (Signed)
[facility] EMERGENCY DEPARTMENT Provider Note   CSN: [id number] Arrival date & time: 06/27/21  6270     History Chief Complaint  Patient presents with   Back Pain    Jeanne Shepard is a 55 y.o. female.  55 y.o female with a PMH of GERD, asthma, DM presents to the ED with a chief complaint of left flank pain x 2 days. Describing it as sharp, aching, stabbing intermittent sensation with radiation down to her left lower abdomen.  Describes the pain comes in waves.  There is some pressure with urination, however she declines any dysuria.  She endorses episodes of nonbilious, nonbloody emesis that have occurred in the last couple days.  She also reports her blood sugar has been running highly elevated despite her taking her medication.  She denies any alleviating or exacerbating factors.  Does take aspirin daily, however did not take anything for pain control.  Denies any fever, surgical history to her abdomen, prior history of kidney stones.    The history is provided by the patient and medical records.  Back Pain Location:  Lumbar spine Associated symptoms: abdominal pain   Associated symptoms: no chest pain, no fever and no headaches       Past Medical History:  Diagnosis Date   Arthritis    Asthma    Diabetes mellitus without complication (HCC)    Hypertension    Neuropathy     Patient Active Problem List   Diagnosis Date Noted   Neck pain 12/05/2017   Status post right knee replacement 02/28/2017   Pulmonary nodule 01/14/2017   Hyperthyroidism 01/30/2016   Primary insomnia 08/26/2015   Depression 05/30/2014   Polyneuropathy in diabetes (HCC) 04/03/2014   Obstructive sleep apnea 04/03/2014   Mixed incontinence 04/03/2014   Mild intermittent asthma, uncomplicated 04/03/2014   Migraine without status migrainosus, not intractable 04/03/2014   Lumbar radiculopathy 04/03/2014   Gastro-esophageal reflux disease without esophagitis 04/03/2014   Diabetes  mellitus with neurological manifestation (HCC) 04/03/2014   Cocaine dependence in remission (HCC) 04/03/2014   Chronic pain syndrome 04/03/2014   Allergic rhinitis 04/03/2014   Anxiety disorder 04/03/2014   Hypercholesterolemia 01/06/2013   Chronic recurrent major depressive disorder (HCC) 01/06/2013   Post-traumatic stress disorder 01/06/2013   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     History reviewed. No pertinent family history.  Social History   Tobacco Use   Smoking status: Former   Smokeless tobacco: Never  Building services engineer Use: Never used  Substance Use Topics   Alcohol use: Not Currently   Drug use: Not Currently    Home Medications Prior to Admission medications   Medication Sig Start Date End Date Taking? Authorizing Provider  lidocaine (LIDODERM) 5 % Place 1 patch onto the skin daily for 6 days. Remove & Discard patch within 12 hours or as directed by MD 06/27/21 07/03/21 Yes Charley Lafrance, Leonie Douglas, PA-C  naproxen (NAPROSYN) 375 MG tablet Take 1 tablet (375 mg total) by mouth 2 (two) times daily for 7 days. 06/27/21 07/04/21 Yes Temperance Kelemen, Leonie Douglas, PA-C  albuterol (VENTOLIN HFA)  108 (90 Base) MCG/ACT inhaler Inhale into the lungs.    [provider]  amLODipine (NORVASC) 10 MG tablet TK 1 T PO ONCE DAILY 09/21/17   [provider]  aspirin EC 81 MG tablet Take by mouth. 07/01/17   [provider]  Estradiol 10 MCG TABS vaginal tablet INSERT 1 TABLET INTRAVAGINALLY QHS FOR 2 WEEKS THEN TWICE WEEKLY 11/16/17   [provider]  fluticasone furoate-vilanterol (BREO ELLIPTA) 100-25 MCG/INH AEPB  INHALE 1 PUFF INTO THE LUNGS DAILY 12/15/16   [provider]  gabapentin (NEURONTIN) 600 MG tablet  11/17/17   [provider]  lisinopril (PRINIVIL,ZESTRIL) 5 MG tablet TK 1 T PO ONCE DAILY 08/18/17   [provider]  metFORMIN (GLUCOPHAGE-XR) 500 MG 24 hr tablet TK 2 TS PO ATN 10/31/17   [provider]  methimazole (TAPAZOLE) 5 MG tablet  08/27/17   [provider]  methocarbamol (ROBAXIN) 500 MG tablet Take 1 tablet (500 mg total) by mouth every 8 (eight) hours as needed. 02/16/18   Dene Gentry, MD  omeprazole (PRILOSEC) 20 MG capsule TK 1 C PO BID 08/29/17   [provider]  pravastatin (PRAVACHOL) 40 MG tablet  10/14/17   [provider]  tiZANidine (ZANAFLEX) 4 MG tablet TAKE 1 TABLET(4 MG) BY MOUTH EVERY 8 HOURS AS NEEDED FOR MUSCLE SPASMS 06/27/18   Hudnall, Sharyn Lull, MD  Vitamin D, Ergocalciferol, (DRISDOL) 50000 units CAPS capsule Take by mouth. 03/31/17   [provider]  zolpidem (AMBIEN) 10 MG tablet TK 1 T PO Q NIGHT PRF SLP 10/26/17   [provider]    Allergies    Tramadol and Vicodin [hydrocodone-acetaminophen]  Review of Systems   Review of Systems  Constitutional:  Negative for chills and fever.  HENT:  Negative for sore throat.   Respiratory:  Negative for shortness of breath.   Cardiovascular:  Negative for chest pain.  Gastrointestinal:  Positive for abdominal pain and vomiting. Negative for nausea.  Genitourinary:  Positive for difficulty urinating, flank pain and urgency.  Musculoskeletal:  Positive for back pain.  Neurological:  Negative for headaches.  All other systems reviewed and are negative.  Physical Exam Updated Vital Signs BP 122/70 (BP Location: Right Arm)   Pulse 72   Temp 98 F (36.7 C) (Oral)   Resp 18   Ht 5\' 1"  (1.549 m)   Wt 99.8 kg   SpO2 97%   BMI 41.57 kg/m   Physical Exam Vitals and nursing note reviewed.  Constitutional:      Appearance: Normal  appearance.  HENT:     Head: Normocephalic and atraumatic.     Nose: Nose normal.  Eyes:     Pupils: Pupils are equal, round, and reactive to light.  Cardiovascular:     Rate and Rhythm: Normal rate.  Pulmonary:     Effort: Pulmonary effort is normal.     Breath sounds: No wheezing or rales.  Abdominal:     General: Abdomen is flat.     Palpations: Abdomen is soft.     Tenderness: There is left CVA tenderness.  Musculoskeletal:     Cervical back: Normal range of motion and neck supple.  Skin:    General: Skin is warm and dry.  Neurological:     Mental Status: She is alert and oriented to person, place, and time.    ED Results / Procedures / Treatments   Labs (all labs ordered are listed, but only abnormal  results are displayed) Labs Reviewed  CBC WITH DIFFERENTIAL/PLATELET - Abnormal; Notable for the following components:      Result Value   Hemoglobin 10.9 (*)    HCT 33.2 (*)    MCV 72.5 (*)    MCH 23.8 (*)    RDW 17.6 (*)    All other components within normal limits  COMPREHENSIVE METABOLIC PANEL - Abnormal; Notable for the following components:   Glucose, Bld 136 (*)    Calcium 8.7 (*)    Total Bilirubin 0.2 (*)    All other components within normal limits  URINE CULTURE  URINALYSIS, ROUTINE W REFLEX MICROSCOPIC  LIPASE, BLOOD    EKG None  Radiology CT Renal Stone Study  Result Date: 06/27/2021 CLINICAL DATA:  Left flank pain for 3 days. Dysuria. Suspected kidney stone. EXAM: CT ABDOMEN AND PELVIS WITHOUT CONTRAST TECHNIQUE: Multidetector CT imaging of the abdomen and pelvis was performed following the standard protocol without IV contrast. COMPARISON:  09/15/2020 FINDINGS: Lower chest: No acute findings. Hepatobiliary: Moderate diffuse hepatic steatosis, without significant change. Hepatomegaly again noted. No mass visualized on this unenhanced exam. Gallbladder is unremarkable. No evidence of biliary ductal dilatation. Pancreas: No mass or inflammatory process  visualized on this unenhanced exam. Spleen:  Within normal limits in size. Adrenals/Urinary tract: No evidence of urolithiasis or hydronephrosis. Unremarkable unopacified urinary bladder. Stomach/Bowel: No evidence of obstruction, inflammatory process, or abnormal fluid collections. Normal appendix visualized. Mild left-sided colonic diverticulosis noted, however there is no evidence of diverticulitis. Vascular/Lymphatic: No pathologically enlarged lymph nodes identified. No evidence of abdominal aortic aneurysm. Reproductive: 2.8 cm uterine fibroid again seen in the right fundal region. Otherwise unremarkable. Other:  None. Musculoskeletal:  No suspicious bone lesions identified. IMPRESSION: No evidence of urolithiasis, hydronephrosis, or other acute findings. Stable moderate hepatic steatosis and hepatomegaly. Stable small uterine fibroid. Colonic diverticulosis. No radiographic evidence of diverticulitis. Electronically Signed   By: Marlaine Hind M.D.   On: 06/27/2021 10:45    Procedures Procedures   Medications Ordered in ED Medications  fentaNYL (SUBLIMAZE) injection 50 mcg (50 mcg Intravenous Given 06/27/21 1121)  ondansetron (ZOFRAN) injection 4 mg (4 mg Intravenous Given 06/27/21 1117)    ED Course  I have reviewed the triage vital signs and the nursing notes.  Pertinent labs & imaging results that were available during my care of the patient were reviewed by me and considered in my medical decision making (see chart for details).    MDM Rules/Calculators/A&P    Patient presented to the ED with a chief complaint of left flank pain  For the past 2 days, pain waxes and wanes.  She has not taken any medication for improvement in his symptoms.  Does have some urinary discomfort such as urinary pressure however denies any dysuria or hematuria.  Does not have any prior history of kidney stones, no similar episodes in the past.  Surgical history of previous C-section.  She is without any  vaginal discharge on today's visit, no prior history of diverticulitis.  During my exam patient is overall nontoxic, non-ill-appearing bowel sounds are all within normal limits, there is tenderness to palpation along the left lower quadrant along with left CVA tenderness.  Findings are clear to auscultation.  Moves all upper and lower extremities.  Pain is reproducible with palpation along the left flank, some suspicion for MSK component.    Rotation of her labs on today's visit reveal a CBC with no leukocytosis, hemoglobin slightly decreased but within her baseline.  CMP  without any electrolyte derangement, creatinine levels within normal limits.  LFTs are unremarkable.  UA is without any signs of infection.  CT Renal showed:  No evidence of urolithiasis, hydronephrosis, or other acute  findings.     Stable moderate hepatic steatosis and hepatomegaly.     Stable small uterine fibroid.     Colonic diverticulosis. No radiographic evidence of diverticulitis.         Reevaluated by me with improvement in symptoms after Zofran and fentanyl, does report she has pain on her body "all the time ".  I did discuss with her more concern for MSK component she will be sent home on a short course of anti-inflammatories to help with symptomatic improvement.  She denies any fever, URI symptoms, have a lower suspicion for influenza at this time, we deferred testing.  Her abdomen remains soft, nontender to palpation after medication.  I feel further emergent work-up not indicated at this time.  Vitals have remained within normal limits, although she does have a history of OSA and is receiving fentanyl and falling asleep O2 saturation did drop below the 90s however she quickly recover upon waking up, placed on some repeated 2 L nasal cannula.  12:23 PM patient ambulated appropriately.  Reports some improvement in symptoms or medication, will treat for muscle strain with anti-inflammatories along with lidocaine  patch.  She is agreeable with plan and treatment, patient is stable for discharge.  Portions of this note were generated with [employer]. Dictation errors may occur despite best attempts at proofreading.  Final Clinical Impression(s) / ED Diagnoses Final diagnoses:  Strain of lumbar region, initial encounter    Rx / DC Orders ED Discharge Orders          Ordered    naproxen (NAPROSYN) 375 MG tablet  2 times daily        06/27/21 1225    lidocaine (LIDODERM) 5 %  Every 24 hours        06/27/21 1225             Janeece Fitting, PA-C 06/27/21 1225    Regan Lemming, MD 06/27/21 1418

## 2021-06-27 NOTE — ED Triage Notes (Signed)
Presents with back pain, taking deep breaths pain is worse, having to strain with urinating and having dysuria, pain is left lower back and radiates to left hip to left leg. Onset this past Thursday.

## 2021-06-27 NOTE — ED Notes (Signed)
Ambulated in hallway on room air.  O2 Sats maintained 95-97%. Gait steady. NAD Reports pain radiating down her hip to posterior thigh at 7/10 when ambulating

## 2021-06-27 NOTE — Discharge Instructions (Addendum)
Laboratory results are within normal limits today.  Please follow-up with your primary care physician as needed.  Prescribed a short course of anti-inflammatories to help with your symptoms, please take this medication as prescribed.

## 2021-06-27 NOTE — ED Notes (Signed)
Placed Jeanne Shepard on 2L O2 via [place].  She has received pain medicine and she has a history of OSA.  She was asleep and her O2 sats dropped to 87%.  O2 sat = 98% on 2L

## 2021-06-28 LAB — URINE CULTURE: Culture: 30000 — AB

## 2021-09-23 IMAGING — CT CT NECK W/ CM
3 of 4 series · 13 of 33 positions shown, 16 images · IV contrast (Omnipaque)
Comparison: None available.

CLINICAL DATA: Initial evaluation for acute right neck pain,
swelling, tenderness.

EXAM:
CT NECK WITH CONTRAST
TECHNIQUE: Multidetector CT imaging of the neck was performed using the
standard protocol following the bolus administration of intravenous
contrast.
CONTRAST:  75mL OMNIPAQUE IOHEXOL 300 MG/ML  SOLN

[Series 2: axial neck · axial · 0.54mm/px · z∈[+798,+932]mm · 5 of 101 slices shown, 7 images]
[im 17/101  soft-tissue]
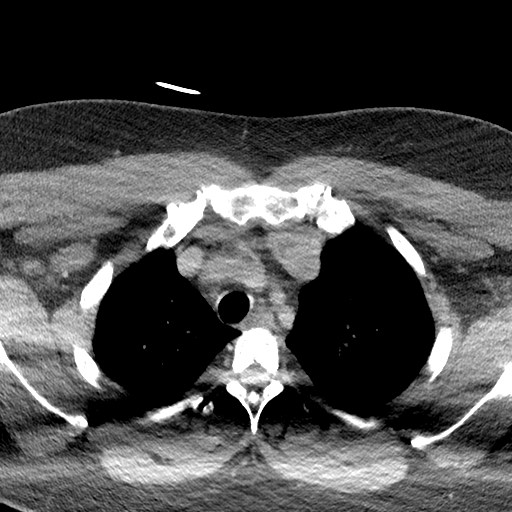
[im 17/101  bone]
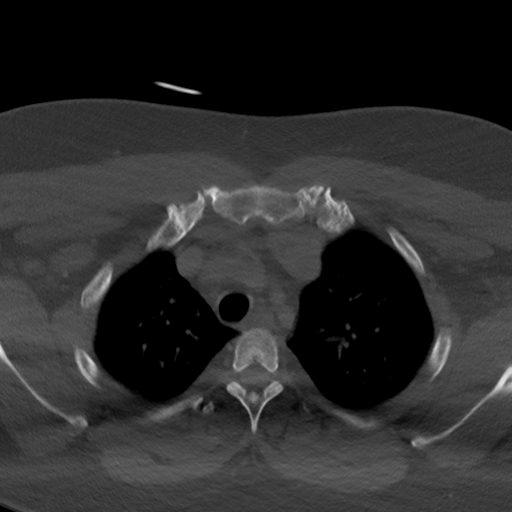
[im 34/101  bone]
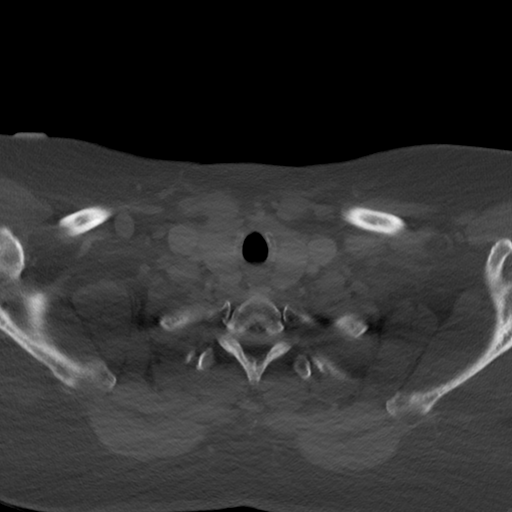
[im 51/101  bone]
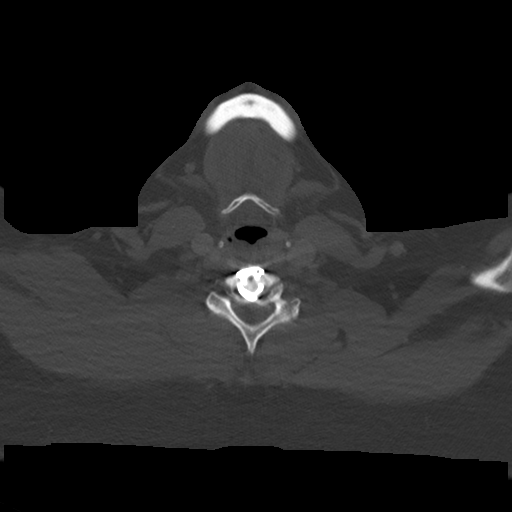
[im 67/101  bone]
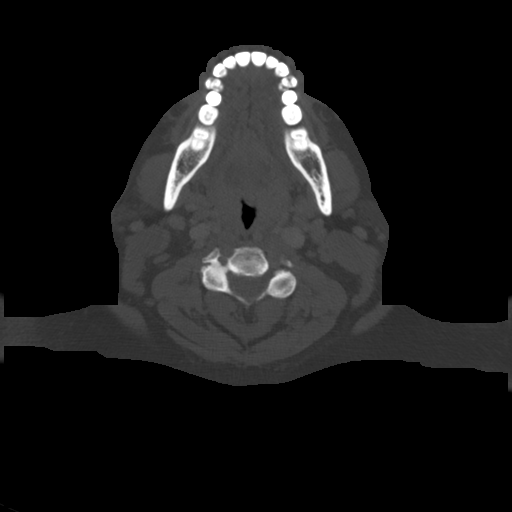
[im 84/101  soft-tissue]
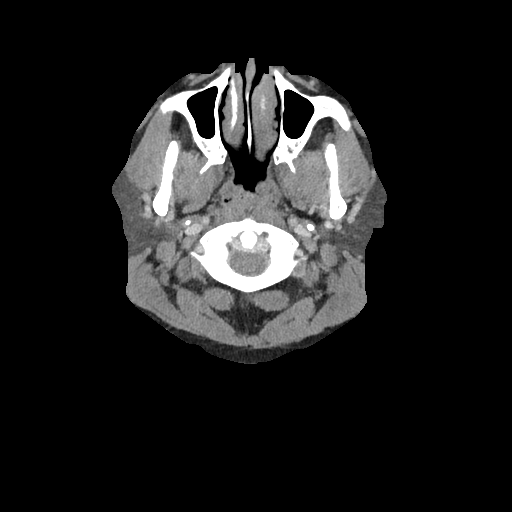
[im 84/101  bone]
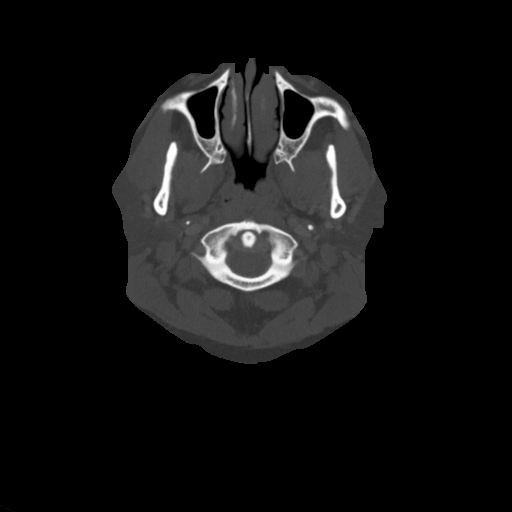

[Series 3: sag neck · sagittal · 0.39mm/px · 5 of 107 slices shown, 6 images]
[im 36/107  bone]
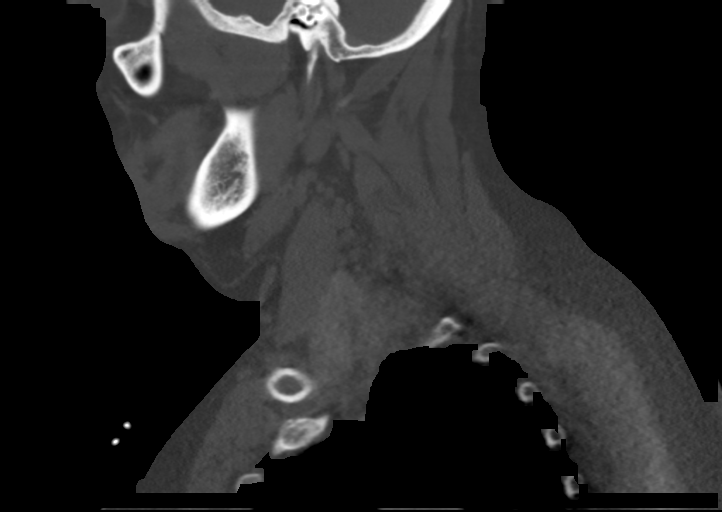
[im 45/107  bone]
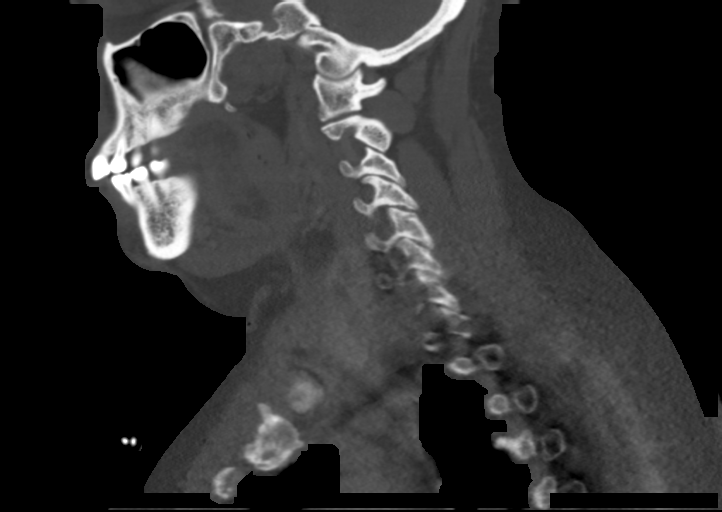
[im 54/107  soft-tissue]
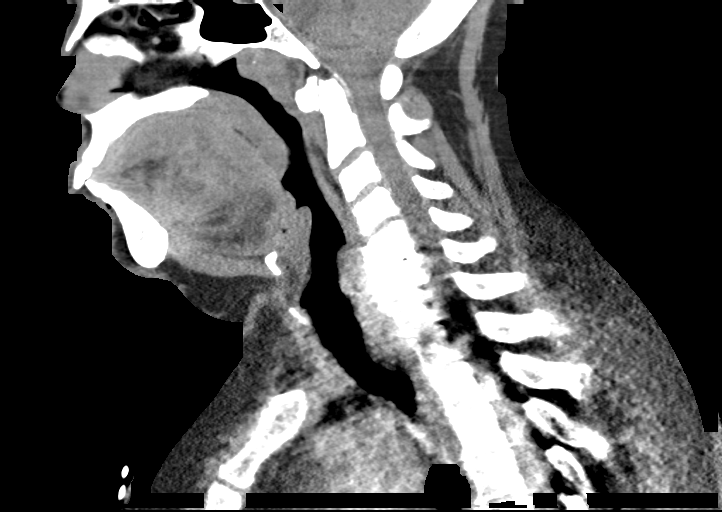
[im 54/107  bone]
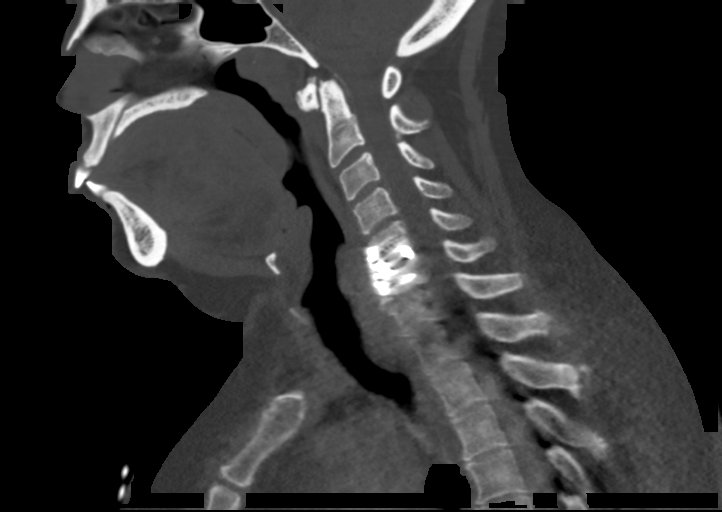
[im 62/107  bone]
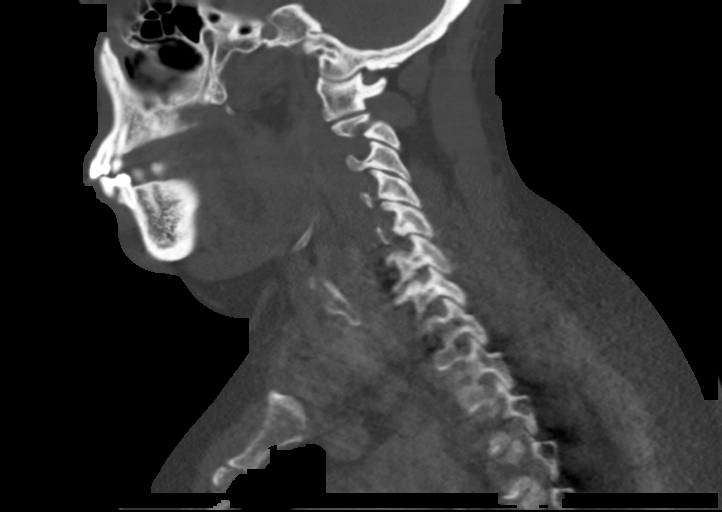
[im 71/107  bone]
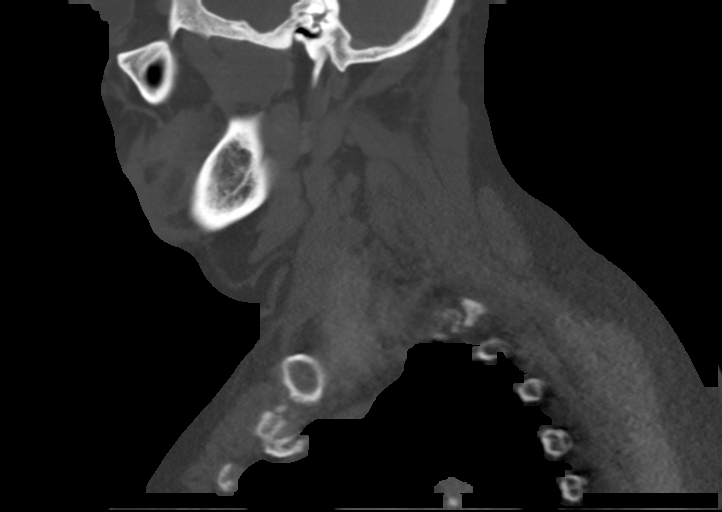

[Series 4: cor neck · coronal · 0.39mm/px · 3 of 125 slices shown]
[im 25/125  bone]
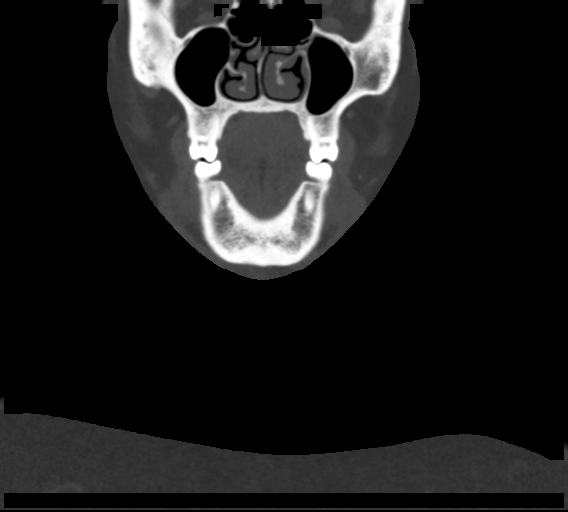
[im 50/125  bone]
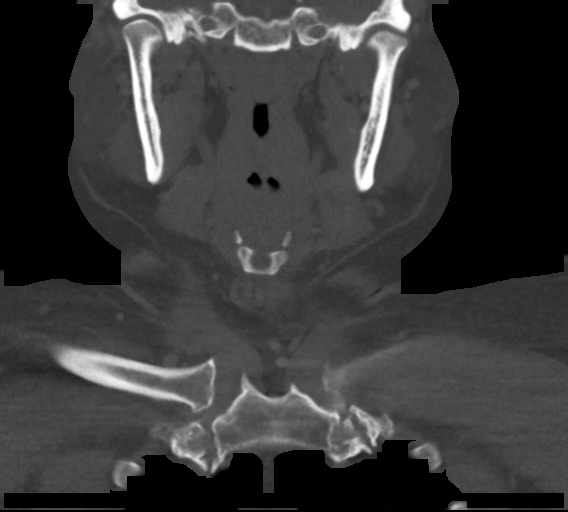
[im 75/125  bone]
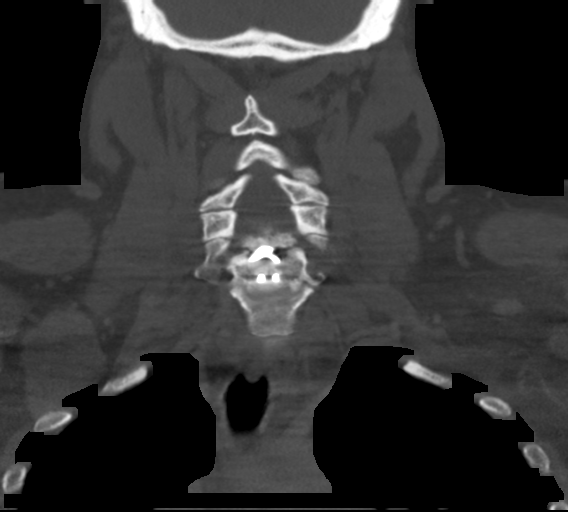

[13 of 33 positions shown; findings below may reference images not displayed]

FINDINGS: Pharynx and larynx: Oral cavity within normal limits. Palatine
tonsils symmetric and within normal limits. Oropharynx and
nasopharynx normal. No retropharyngeal collection or swelling.
Epiglottis within normal limits. Vallecula largely effaced by the
lingual tonsils. Remainder of the hypopharynx and supraglottic
larynx within normal limits. Glottis normal. Subglottic airway
clear.

Salivary glands: Salivary glands including the parotid and
submandibular glands are within normal limits.

Thyroid: Subtle 5 mm hypodense nodule present at the upper pole of
the left thyroid gland (series 2, image 64). Thyroid otherwise
unremarkable without mass lesion or other acute abnormality.

Lymph nodes: No enlarged or pathologic adenopathy identified within
the neck.

Vascular: Normal intravascular enhancement seen throughout the neck.

Limited intracranial: Unremarkable.

Visualized orbits: Visualized globes and orbital soft tissues
demonstrate no acute finding. Patient status post ocular lens
replacement on the right.

Mastoids and visualized paranasal sinuses: Visualized paranasal
sinuses are clear. Visualized mastoids and middle ear cavities are
well pneumatized and free of fluid.

Skeleton: No acute osseous abnormality. No discrete or worrisome
osseous lesions. Patient is status post ACDF at C5-6. No significant
dental disease identified.

Upper chest: Visualized upper chest demonstrates no acute finding.
Partially visualized lungs are grossly clear.

Other: None. No significant inflammatory changes seen within the
right neck or elsewhere. No other findings to explain patient's
symptoms identified.
IMPRESSION: 1. Negative CT of the neck. No acute inflammatory changes or other
abnormality identified.
2. 5 mm left thyroid nodule. This is felt to be of doubtful
significance given size and patient age. No follow-up imaging
recommended. (Ref: [HOSPITAL]. [DATE]): 143-50).
3. Status post ACDF at C5-6.

## 2021-11-03 ENCOUNTER — Emergency Department (HOSPITAL_BASED_OUTPATIENT_CLINIC_OR_DEPARTMENT_OTHER): Payer: Medicare Other

## 2021-11-03 ENCOUNTER — Other Ambulatory Visit: Payer: Self-pay

## 2021-11-03 ENCOUNTER — Encounter (HOSPITAL_BASED_OUTPATIENT_CLINIC_OR_DEPARTMENT_OTHER): Payer: Self-pay | Admitting: Emergency Medicine

## 2021-11-03 ENCOUNTER — Emergency Department (HOSPITAL_BASED_OUTPATIENT_CLINIC_OR_DEPARTMENT_OTHER)
Admission: EM | Admit: 2021-11-03 | Discharge: 2021-11-03 | Disposition: A | Discharge status: Home / Self Care | Payer: Medicare Other | Attending: Emergency Medicine | Admitting: Emergency Medicine

## 2021-11-03 DIAGNOSIS — Z7984 Long term (current) use of oral hypoglycemic drugs: Secondary | ICD-10-CM | POA: Diagnosis not present

## 2021-11-03 DIAGNOSIS — Z7982 Long term (current) use of aspirin: Secondary | ICD-10-CM | POA: Diagnosis not present

## 2021-11-03 DIAGNOSIS — R9431 Abnormal electrocardiogram [ECG] [EKG]: Secondary | ICD-10-CM | POA: Diagnosis not present

## 2021-11-03 DIAGNOSIS — M25511 Pain in right shoulder: Secondary | ICD-10-CM

## 2021-11-03 DIAGNOSIS — Z79899 Other long term (current) drug therapy: Secondary | ICD-10-CM | POA: Insufficient documentation

## 2021-11-03 DIAGNOSIS — M25561 Pain in right knee: Secondary | ICD-10-CM

## 2021-11-03 DIAGNOSIS — R6889 Other general symptoms and signs: Secondary | ICD-10-CM

## 2021-11-03 LAB — TROPONIN I (HIGH SENSITIVITY): Troponin I (High Sensitivity): 4 ng/L (ref <18)

## 2021-11-03 LAB — CBC
HCT: 33.5 % — ABNORMAL LOW (ref 36.0–46.0)
Hemoglobin: 10.9 g/dL — ABNORMAL LOW (ref 12.0–15.0)
MCH: 23.7 pg — ABNORMAL LOW (ref 26.0–34.0)
MCHC: 32.5 g/dL (ref 30.0–36.0)
MCV: 72.8 fL — ABNORMAL LOW (ref 80.0–100.0)
Platelets: 183 10*3/uL (ref 150–400)
RBC: 4.6 MIL/uL (ref 3.87–5.11)
RDW: 17 % — ABNORMAL HIGH (ref 11.5–15.5)
WBC: 4.8 10*3/uL (ref 4.0–10.5)
nRBC: 0 % (ref 0.0–0.2)

## 2021-11-03 LAB — MAGNESIUM: Magnesium: 1.9 mg/dL (ref 1.7–2.4)

## 2021-11-03 LAB — TSH: TSH: 3.163 u[IU]/mL (ref 0.350–4.500)

## 2021-11-03 LAB — BASIC METABOLIC PANEL
Anion gap: 8 (ref 5–15)
BUN: 12 mg/dL (ref 6–20)
CO2: 26 mmol/L (ref 22–32)
Calcium: 8.9 mg/dL (ref 8.9–10.3)
Chloride: 107 mmol/L (ref 98–111)
Creatinine, Ser: 0.62 mg/dL (ref 0.44–1.00)
GFR, Estimated: >60 mL/min (ref >60)
Glucose, Bld: 119 mg/dL — ABNORMAL HIGH (ref 70–99)
Potassium: 3.7 mmol/L (ref 3.5–5.1)
Sodium: 141 mmol/L (ref 135–145)

## 2021-11-03 MED ORDER — METHOCARBAMOL 500 MG PO TABS: 500.0000 mg | ORAL_TABLET | Freq: Once | ORAL | Status: DC

## 2021-11-03 MED ORDER — PREDNISONE 10 MG PO TABS: 40.0000 mg | ORAL_TABLET | Freq: Every day | ORAL | 0 refills | Status: AC

## 2021-11-03 NOTE — ED Triage Notes (Addendum)
Pt arrives pov, slow limping gait to room, c/o right knee pain and right side upper back pain with tenderness. "I want to know what this spot on my back is". Denies injury ?

## 2021-11-03 NOTE — Discharge Instructions (Addendum)
Knee pain: Your x ray was normal. You likely have inflammation of the bursa in your knee joint. Continue taking your Diclofenac. I have also prescribed you a short course of Prednisone. Please pick this up from pharmacy. Please follow up with your Orthopedic doctor. ? ?Right shoulder pain: No emergent causes found today for your pain. Please follow up with your PCP and Dermatology regarding the dark spot ? ?Prolonged Qtc Interval on EKG: Please schedule a return visit with your PCP and have them repeat your EKG. If it is still prolonged, they may want to consider stopping your Tizanidine and replacing with a different medication as it could be leading to the prolonged interval. ? ?Enlarged Heart on Chest XR: I have referred you to Cardiology to follow up on this finding. You may need an Echocardiogram to assess this. Please schedule an appointment with them.  ?

## 2021-11-03 NOTE — ED Provider Notes (Signed)
?[facility] EMERGENCY DEPARTMENT ?Provider Note ? ? ?CSN: [id number] ?Arrival date & time: 11/03/21  1039 ? ?  ? ?History ?PMH: DM, HTN, asthma, arthritis, Bilateral knee replacement ?Chief Complaint  ?Patient presents with  ? Knee Pain  ? Back Pain  ? ? ?Jeanne Shepard is a 56 y.o. female. ?Patient presents to the ED with multiple complaints.  Her first complaint is that she has had some right knee pain.  She says that her knee started hurting about 4 days ago.  She says it hurts all the time whether she is moving it, ambulating, or rest.  She says she describes it as an aching, throbbing, or burning sensation.  She had a previous knee replacement that was multiple years ago.  She is able to ambulate, but does have a limp.  She denies any trauma associated with this.  She has an upcoming orthopedic appointment at the end of April.  She denies any redness, swelling, or warmth to the knee.  She denies any fevers or chills. ? ?She also complains of some right posterior shoulder pain.  She states that she had a darkened spot overlying her right shoulder blade.  This is been present for a long time.  She does not think there has been any changes with the color, size, borders, or elevation.  She has an upcoming appointment with dermatology in September.  The reason she came here for this, was that over the past week, she has had worsening right shoulder blade pain. She never had this pain before.  She thinks that pain is overlying the darkened area, but it seems to be present over her entirety of her right shoulder. She says pain is present at all times. It hurts worse with she breathes.  Sometimes she has pain that radiates into her right chest.  She is concerned that this could be either a lung problem or heart problem as she has a family history. Denies any n/v, abdominal pain, visual changes, dizziness, syncope. Denies any recent trauma. ? ? ?Knee Pain ?Associated symptoms: back pain   ?Back Pain ?Associated  symptoms: chest pain   ? ?  ? ?Home Medications ?Prior to Admission medications   ?Medication Sig Start Date End Date Taking? Authorizing Provider  ?predniSONE (DELTASONE) 10 MG tablet Take 4 tablets (40 mg total) by mouth daily for 6 days. 11/03/21 11/09/21 Yes Symiah Nowotny, Finis Bud, PA-C  ?albuterol (VENTOLIN HFA) 108 (90 Base) MCG/ACT inhaler Inhale into the lungs.    [provider]  ?amLODipine (NORVASC) 10 MG tablet TK 1 T PO ONCE DAILY 09/21/17   [provider]  ?aspirin EC 81 MG tablet Take by mouth. 07/01/17   [provider]  ?Estradiol 10 MCG TABS vaginal tablet INSERT 1 TABLET INTRAVAGINALLY QHS FOR 2 WEEKS THEN TWICE WEEKLY 11/16/17   [provider]  ?fluticasone furoate-vilanterol (BREO ELLIPTA) 100-25 MCG/INH AEPB INHALE 1 PUFF INTO THE LUNGS DAILY 12/15/16   [provider]  ?gabapentin (NEURONTIN) 600 MG tablet  11/17/17   [provider]  ?lisinopril (PRINIVIL,ZESTRIL) 5 MG tablet TK 1 T PO ONCE DAILY 08/18/17   [provider]  ?metFORMIN (GLUCOPHAGE-XR) 500 MG 24 hr tablet TK 2 TS PO ATN 10/31/17   [provider]  ?methimazole (TAPAZOLE) 5 MG tablet  08/27/17   [provider]  ?methocarbamol (ROBAXIN) 500 MG tablet Take 1 tablet (500 mg total) by mouth every 8 (eight) hours as needed. 02/16/18   Hudnall, Azucena Fallen,  MD  ?omeprazole (PRILOSEC) 20 MG capsule TK 1 C PO BID 08/29/17   [provider]  ?pravastatin (PRAVACHOL) 40 MG tablet  10/14/17   [provider]  ?tiZANidine (ZANAFLEX) 4 MG tablet TAKE 1 TABLET(4 MG) BY MOUTH EVERY 8 HOURS AS NEEDED FOR MUSCLE SPASMS 06/27/18   Hudnall, Azucena Fallen, MD  ?Vitamin D, Ergocalciferol, (DRISDOL) 50000 units CAPS capsule Take by mouth. 03/31/17   [provider]  ?zolpidem (AMBIEN) 10 MG tablet TK 1 T PO Q NIGHT PRF SLP 10/26/17   [provider]  ?   ? ?Allergies    ?Tramadol and Vicodin [hydrocodone-acetaminophen]   ? ?Review of Systems   ?Review of Systems   ?Cardiovascular:  Positive for chest pain.  ?Musculoskeletal:  Positive for arthralgias and back pain.  ?All other systems reviewed and are negative. ? ?Physical Exam ?Updated Vital Signs ?BP 139/62   Pulse 97   Temp 98.2 ?F (36.8 ?C)   Resp 14   Ht  (1.549 m)   Wt 102.1 kg   SpO2 96%   BMI 42.51 kg/m?  ?Physical Exam ?Vitals and nursing note reviewed.  ?Constitutional:   ?   General: She is not in acute distress. ?   Appearance: Normal appearance. She is not ill-appearing, toxic-appearing or diaphoretic.  ?HENT:  ?   Head: Normocephalic and atraumatic.  ?   Nose: No nasal deformity.  ?   Mouth/Throat:  ?   Lips: Pink. No lesions.  ?   Mouth: Mucous membranes are moist. No injury, lacerations, oral lesions or angioedema.  ?   Pharynx: Oropharynx is clear. Uvula midline. No pharyngeal swelling, oropharyngeal exudate, posterior oropharyngeal erythema or uvula swelling.  ?Eyes:  ?   General: Gaze aligned appropriately. No scleral icterus.    ?   Right eye: No discharge.     ?   Left eye: No discharge.  ?   Conjunctiva/sclera: Conjunctivae normal.  ?   Right eye: Right conjunctiva is not injected. No exudate or hemorrhage. ?   Left eye: Left conjunctiva is not injected. No exudate or hemorrhage. ?   Pupils: Pupils are equal, round, and reactive to light.  ?Cardiovascular:  ?   Rate and Rhythm: Normal rate and regular rhythm.  ?   Pulses: Normal pulses.     ?     Radial pulses are 2+ on the right side and 2+ on the left side.  ?     Dorsalis pedis pulses are 2+ on the right side and 2+ on the left side.  ?   Heart sounds: Normal heart sounds, S1 normal and S2 normal. Heart sounds not distant. No murmur heard. ?  No friction rub. No gallop. No S3 or S4 sounds.  ?Pulmonary:  ?   Effort: Pulmonary effort is normal. No accessory muscle usage or respiratory distress.  ?   Breath sounds: Normal breath sounds. No stridor. No wheezing, rhonchi or rales.  ?Chest:  ?   Chest wall: No tenderness.  ?Abdominal:  ?    General: Abdomen is flat. Bowel sounds are normal. There is no distension.  ?   Palpations: Abdomen is soft. There is no mass or pulsatile mass.  ?   Tenderness: There is no abdominal tenderness. There is no guarding or rebound.  ?Musculoskeletal:  ?   Right lower leg: No edema.  ?   Left lower leg: No edema.  ?   Comments: Right knee is tender to the touch. Mostly in  the anterior side. No increased warmth or erythema to knee. No obvious swelling, bruising, or deformity noted. She is able to flex knee, but has pain with this. Distal pulses intact. Sensation intact.  ? ?Right shoulder and chest pain has mild reproducible pain present. There is an overlying darkened area on right posterior shoulder blade. Approximately 3 cm round. Not raised. Irregular boarders. Appears ecchymotic.   ?Skin: ?   General: Skin is warm and dry.  ?   Coloration: Skin is not jaundiced or pale.  ?   Findings: No bruising, erythema, lesion or rash.  ?Neurological:  ?   General: No focal deficit present.  ?   Mental Status: She is alert and oriented to person, place, and time.  ?   GCS: GCS eye subscore is 4. GCS verbal subscore is 5. GCS motor subscore is 6.  ?Psychiatric:     ?   Mood and Affect: Mood normal.     ?   Behavior: Behavior normal. Behavior is cooperative.  ? ? ?ED Results / Procedures / Treatments   ?Labs ?(all labs ordered are listed, but only abnormal results are displayed) ?Labs Reviewed  ?BASIC METABOLIC PANEL - Abnormal; Notable for the following components:  ?    Result Value  ? Glucose, Bld 119 (*)   ? All other components within normal limits  ?CBC - Abnormal; Notable for the following components:  ? Hemoglobin 10.9 (*)   ? HCT 33.5 (*)   ? MCV 72.8 (*)   ? MCH 23.7 (*)   ? RDW 17.0 (*)   ? All other components within normal limits  ?MAGNESIUM  ?TSH  ?TROPONIN I (HIGH SENSITIVITY)  ? ? ?EKG ?EKG Interpretation ? ?Date/Time:  Tuesday November 03 2021 11:19:57 EDT ?Ventricular Rate:  78 ?PR Interval:  141 ?QRS  Duration: 53 ?QT Interval:  500 ?QTC Calculation: 570 ?R Axis:   39 ?Text Interpretation: Sinus rhythm Low voltage, precordial leads Borderline T abnormalities, diffuse leads Prolonged QT interval Confirmed by Jason Coop

## 2022-02-27 DIAGNOSIS — Z7984 Long term (current) use of oral hypoglycemic drugs: Secondary | ICD-10-CM | POA: Insufficient documentation

## 2022-02-27 DIAGNOSIS — R109 Unspecified abdominal pain: Secondary | ICD-10-CM | POA: Insufficient documentation

## 2022-02-27 DIAGNOSIS — Z7982 Long term (current) use of aspirin: Secondary | ICD-10-CM | POA: Insufficient documentation

## 2022-02-27 DIAGNOSIS — E119 Type 2 diabetes mellitus without complications: Secondary | ICD-10-CM | POA: Diagnosis not present

## 2022-02-27 DIAGNOSIS — Z79899 Other long term (current) drug therapy: Secondary | ICD-10-CM | POA: Insufficient documentation

## 2022-02-27 DIAGNOSIS — I10 Essential (primary) hypertension: Secondary | ICD-10-CM | POA: Insufficient documentation

## 2022-02-28 ENCOUNTER — Emergency Department (HOSPITAL_BASED_OUTPATIENT_CLINIC_OR_DEPARTMENT_OTHER)
Admission: EM | Admit: 2022-02-28 | Discharge: 2022-02-28 | Disposition: A | Discharge status: Home / Self Care | Payer: Medicare Other | Attending: Emergency Medicine | Admitting: Emergency Medicine

## 2022-02-28 ENCOUNTER — Encounter (HOSPITAL_BASED_OUTPATIENT_CLINIC_OR_DEPARTMENT_OTHER): Payer: Self-pay | Admitting: Emergency Medicine

## 2022-02-28 ENCOUNTER — Emergency Department (HOSPITAL_BASED_OUTPATIENT_CLINIC_OR_DEPARTMENT_OTHER): Payer: Medicare Other

## 2022-02-28 ENCOUNTER — Other Ambulatory Visit: Payer: Self-pay

## 2022-02-28 DIAGNOSIS — R109 Unspecified abdominal pain: Secondary | ICD-10-CM | POA: Diagnosis not present

## 2022-02-28 LAB — CBC WITH DIFFERENTIAL/PLATELET
Abs Immature Granulocytes: 0.03 10*3/uL (ref 0.00–0.07)
Basophils Absolute: 0 10*3/uL (ref 0.0–0.1)
Basophils Relative: 0 %
Eosinophils Absolute: 0.1 10*3/uL (ref 0.0–0.5)
Eosinophils Relative: 1 %
HCT: 37.2 % (ref 36.0–46.0)
Hemoglobin: 12 g/dL (ref 12.0–15.0)
Immature Granulocytes: 0 %
Lymphocytes Relative: 21 %
Lymphs Abs: 1.8 10*3/uL (ref 0.7–4.0)
MCH: 23.7 pg — ABNORMAL LOW (ref 26.0–34.0)
MCHC: 32.3 g/dL (ref 30.0–36.0)
MCV: 73.5 fL — ABNORMAL LOW (ref 80.0–100.0)
Monocytes Absolute: 0.6 10*3/uL (ref 0.1–1.0)
Monocytes Relative: 8 %
Neutro Abs: 5.7 10*3/uL (ref 1.7–7.7)
Neutrophils Relative %: 70 %
Platelets: 212 10*3/uL (ref 150–400)
RBC: 5.06 MIL/uL (ref 3.87–5.11)
RDW: 17.1 % — ABNORMAL HIGH (ref 11.5–15.5)
WBC: 8.2 10*3/uL (ref 4.0–10.5)
nRBC: 0 % (ref 0.0–0.2)

## 2022-02-28 LAB — URINALYSIS, ROUTINE W REFLEX MICROSCOPIC
Bilirubin Urine: NEGATIVE
Glucose, UA: NEGATIVE mg/dL
Hgb urine dipstick: NEGATIVE
Ketones, ur: NEGATIVE mg/dL
Leukocytes,Ua: NEGATIVE
Nitrite: NEGATIVE
Protein, ur: NEGATIVE mg/dL
Specific Gravity, Urine: 1.025 (ref 1.005–1.030)
pH: 5.5 (ref 5.0–8.0)

## 2022-02-28 LAB — COMPREHENSIVE METABOLIC PANEL
ALT: 20 U/L (ref 0–44)
AST: 15 U/L (ref 15–41)
Albumin: 4.1 g/dL (ref 3.5–5.0)
Alkaline Phosphatase: 105 U/L (ref 38–126)
Anion gap: 8 (ref 5–15)
BUN: 10 mg/dL (ref 6–20)
CO2: 26 mmol/L (ref 22–32)
Calcium: 9.4 mg/dL (ref 8.9–10.3)
Chloride: 106 mmol/L (ref 98–111)
Creatinine, Ser: 0.69 mg/dL (ref 0.44–1.00)
GFR, Estimated: >60 mL/min (ref >60)
Glucose, Bld: 139 mg/dL — ABNORMAL HIGH (ref 70–99)
Potassium: 3.4 mmol/L — ABNORMAL LOW (ref 3.5–5.1)
Sodium: 140 mmol/L (ref 135–145)
Total Bilirubin: 0.5 mg/dL (ref 0.3–1.2)
Total Protein: 7.8 g/dL (ref 6.5–8.1)

## 2022-02-28 MED ORDER — HYDROMORPHONE HCL 1 MG/ML IJ SOLN
1.0000 mg | Freq: Once | INTRAMUSCULAR | Status: AC
  Administered 2022-02-28: 1 mg via INTRAVENOUS
  Filled 2022-02-28: qty 1

## 2022-02-28 MED ORDER — KETOROLAC TROMETHAMINE 15 MG/ML IJ SOLN
15.0000 mg | Freq: Once | INTRAMUSCULAR | Status: AC
  Administered 2022-02-28: 15 mg via INTRAVENOUS
  Filled 2022-02-28: qty 1

## 2022-02-28 MED ORDER — MORPHINE SULFATE (PF) 4 MG/ML IV SOLN
4.0000 mg | Freq: Once | INTRAVENOUS | Status: AC
  Administered 2022-02-28: 4 mg via INTRAVENOUS
  Filled 2022-02-28: qty 1

## 2022-02-28 NOTE — ED Triage Notes (Signed)
Pt states L flank pain that started suddenly ~1300 today. She has been taking oxy without relief. She also reports pain with urination, denies hematuria. She states hx of renal and hepatic failure, and "this feels the same".

## 2022-02-28 NOTE — ED Notes (Signed)
Unable to get urine at this time, will ask when we have an exam room ready.

## 2022-02-28 NOTE — ED Provider Notes (Signed)
[facility] EMERGENCY DEPARTMENT Provider Note   CSN: [id number] Arrival date & time: 02/27/22  2351     History  Chief Complaint  Patient presents with   Flank Pain    Jeanne Shepard is a 56 y.o. female.  The history is provided by the patient and medical records.  Flank Pain  Jeanne Shepard is a 56 y.o. female who presents to the Emergency Department complaining of flank pain.  She presents to the emergency department complaining of left flank pain that started around 1230 today.  Pain is located along her ribs on the left down to her buttocks on the left.  It does not go down her legs.  She has associated dysuria.  No fever, shortness of breath.  Pain does wrap around to her left abdomen.  She has experienced similar pain in the past when she had renal and liver failure.  No reports of recent injuries.  She has a history of type 2 diabetes, hypertension.  She does take oxycodone 5 mg daily and her prescription was just increased today to oxycodone 10 mg 3 times daily. Left flank pain since 1230   No hormone use.  No history of DVT/PE.  Home Medications Prior to Admission medications   Medication Sig Start Date End Date Taking? Authorizing Provider  albuterol (VENTOLIN HFA) 108 (90 Base) MCG/ACT inhaler Inhale into the lungs.    [provider]  amLODipine (NORVASC) 10 MG tablet TK 1 T PO ONCE DAILY 09/21/17   [provider]  aspirin EC 81 MG tablet Take by mouth. 07/01/17   [provider]  Estradiol 10 MCG TABS vaginal tablet INSERT 1 TABLET INTRAVAGINALLY QHS FOR 2 WEEKS THEN TWICE WEEKLY 11/16/17   [provider]  fluticasone furoate-vilanterol (BREO ELLIPTA) 100-25 MCG/INH AEPB INHALE 1 PUFF INTO THE LUNGS DAILY 12/15/16   [provider]  gabapentin (NEURONTIN) 600 MG tablet  11/17/17   [provider]  lisinopril (PRINIVIL,ZESTRIL) 5 MG tablet TK 1 T PO ONCE DAILY 08/18/17   [provider]  metFORMIN  (GLUCOPHAGE-XR) 500 MG 24 hr tablet TK 2 TS PO ATN 10/31/17   [provider]  methimazole (TAPAZOLE) 5 MG tablet  08/27/17   [provider]  methocarbamol (ROBAXIN) 500 MG tablet Take 1 tablet (500 mg total) by mouth every 8 (eight) hours as needed. 02/16/18   Lenda Kelp, MD  omeprazole (PRILOSEC) 20 MG capsule TK 1 C PO BID 08/29/17   [provider]  pravastatin (PRAVACHOL) 40 MG tablet  10/14/17   [provider]  tiZANidine (ZANAFLEX) 4 MG tablet TAKE 1 TABLET(4 MG) BY MOUTH EVERY 8 HOURS AS NEEDED FOR MUSCLE SPASMS 06/27/18   Hudnall, Azucena Fallen, MD  Vitamin D, Ergocalciferol, (DRISDOL) 50000 units CAPS capsule Take by mouth. 03/31/17   [provider]  zolpidem (AMBIEN) 10 MG tablet TK 1 T PO Q NIGHT PRF SLP 10/26/17   [provider]      Allergies    Tramadol and Vicodin [hydrocodone-acetaminophen]    Review of Systems   Review of Systems  Genitourinary:  Positive for flank pain.  All other systems reviewed and are negative.   Physical Exam Updated Vital Signs BP 117/60 (BP Location: Right Arm)   Pulse 98   Temp 98.3 F (36.8 C) (Oral)   Resp 20   Ht 5\' 2"  (1.575 m)   Wt 99.8 kg   SpO2 100%   BMI 40.24 kg/m  Physical Exam Vitals and nursing note reviewed.  Constitutional:      Appearance: She is well-developed.  HENT:     Head: Normocephalic and atraumatic.  Cardiovascular:     Rate and Rhythm: Normal rate and regular rhythm.     Heart sounds: No murmur heard. Pulmonary:     Effort: Pulmonary effort is normal. No respiratory distress.     Breath sounds: Normal breath sounds.  Abdominal:     Palpations: Abdomen is soft.     Tenderness: There is left CVA tenderness. There is no guarding or rebound.     Comments: Moderate left sided abdominal tenderness  Musculoskeletal:        General: No tenderness.     Comments: 2+ DP pulses bilaterally.  1+ pitting edema to BLE  Skin:    General: Skin is warm and dry.      Findings: No rash.  Neurological:     Mental Status: She is alert and oriented to person, place, and time.     Comments: 5 out of 5 strength in bilateral distal lower extremities with sensation to light touch intact in bilateral distal lower extremities.  Pain in back limits strength testing in her proximal lower extremities  Psychiatric:        Behavior: Behavior normal.     ED Results / Procedures / Treatments   Labs (all labs ordered are listed, but only abnormal results are displayed) Labs Reviewed  CBC WITH DIFFERENTIAL/PLATELET - Abnormal; Notable for the following components:      Result Value   MCV 73.5 (*)    MCH 23.7 (*)    RDW 17.1 (*)    All other components within normal limits  COMPREHENSIVE METABOLIC PANEL - Abnormal; Notable for the following components:   Potassium 3.4 (*)    Glucose, Bld 139 (*)    All other components within normal limits  URINALYSIS, ROUTINE W REFLEX MICROSCOPIC    EKG None  Radiology CT Renal Stone Study  Result Date: 02/28/2022 CLINICAL DATA:  Flank pain on the left for several hours EXAM: CT ABDOMEN AND PELVIS WITHOUT CONTRAST TECHNIQUE: Multidetector CT imaging of the abdomen and pelvis was performed following the standard protocol without IV contrast. RADIATION DOSE REDUCTION: This exam was performed according to the departmental dose-optimization program which includes automated exposure control, adjustment of the mA and/or kV according to patient size and/or use of iterative reconstruction technique. COMPARISON:  06/27/2021 FINDINGS: Lower chest: No acute abnormality. Hepatobiliary: No focal liver abnormality is seen. No gallstones, gallbladder wall thickening, or biliary dilatation. Pancreas: Unremarkable. No pancreatic ductal dilatation or surrounding inflammatory changes. Spleen: Normal in size without focal abnormality. Adrenals/Urinary Tract: Adrenal glands are within normal limits. Kidneys show no renal calculi or obstructive changes.  The ureters are within normal limits. The bladder is decompressed. Stomach/Bowel: Scattered diverticular change of the colon is noted without evidence of diverticulitis. No obstructive or inflammatory changes of the colon are seen. The appendix is within normal limits. Small bowel and stomach are unremarkable. Vascular/Lymphatic: Aortic atherosclerosis. No enlarged abdominal or pelvic lymph nodes. Reproductive: Uterus and bilateral adnexa are unremarkable. Other: No abdominal wall hernia or abnormality. No abdominopelvic ascites. Musculoskeletal: No acute or significant osseous findings. IMPRESSION: Diverticulosis without diverticulitis. No other focal abnormality is noted. Electronically Signed   By: Alcide Clever M.D.   On: 02/28/2022 01:55    Procedures Procedures    Medications Ordered in ED Medications  morphine (PF) 4 MG/ML injection 4 mg (4 mg  Intravenous Given 02/28/22 0151)  HYDROmorphone (DILAUDID) injection 1 mg (1 mg Intravenous Given 02/28/22 0328)  ketorolac (TORADOL) 15 MG/ML injection 15 mg (15 mg Intravenous Given 02/28/22 0328)    ED Course/ Medical Decision Making/ A&P                           Medical Decision Making Amount and/or Complexity of Data Reviewed Labs: ordered. Radiology: ordered.  Risk Prescription drug management.   Patient with history of diabetes, hypertension here for evaluation of left-sided flank and abdominal pain.  She is nontoxic-appearing on evaluation.  She does have some left-sided abdominal and flank tenderness without overlying rash.  She is well-perfused distally.  Labs with normal hepatic and renal function.  CBC unremarkable.  UA is not consistent with UTI.  CT stone study obtained given her symptoms, which is negative for acute obstructing stone, abscess or obstruction.  Discussed with patient incidental finding of diverticulosis.  Current clinical picture is not consistent with PE, dissection, UTI, acute cord compression.  Patient had no  significant change in her symptoms with morphine administration.  Will attempt additional round of medications and reevaluate.  Pain improved on reassessment after additional medications.  She is able to move bilateral lower extremities with greater ease.  Discussed with patient unclear source of symptoms.  Feel she is stable at this point in time to discharge home with outpatient follow-up and return precautions.        Final Clinical Impression(s) / ED Diagnoses Final diagnoses:  Left flank pain    Rx / DC Orders ED Discharge Orders     None         Tilden Fossa, MD 02/28/22 [phone]

## 2022-02-28 NOTE — ED Notes (Signed)
ED Provider at bedside. 

## 2022-02-28 NOTE — ED Notes (Signed)
Patient transported to CT 

## 2022-08-23 ENCOUNTER — Emergency Department (HOSPITAL_BASED_OUTPATIENT_CLINIC_OR_DEPARTMENT_OTHER)
Admission: EM | Admit: 2022-08-23 | Discharge: 2022-08-23 | Disposition: A | Discharge status: Home / Self Care | Payer: 59 | Attending: Emergency Medicine | Admitting: Emergency Medicine

## 2022-08-23 ENCOUNTER — Other Ambulatory Visit: Payer: Self-pay

## 2022-08-23 ENCOUNTER — Encounter (HOSPITAL_BASED_OUTPATIENT_CLINIC_OR_DEPARTMENT_OTHER): Payer: Self-pay

## 2022-08-23 DIAGNOSIS — Z7982 Long term (current) use of aspirin: Secondary | ICD-10-CM | POA: Diagnosis not present

## 2022-08-23 DIAGNOSIS — E1165 Type 2 diabetes mellitus with hyperglycemia: Secondary | ICD-10-CM | POA: Insufficient documentation

## 2022-08-23 DIAGNOSIS — Z7984 Long term (current) use of oral hypoglycemic drugs: Secondary | ICD-10-CM | POA: Insufficient documentation

## 2022-08-23 DIAGNOSIS — E876 Hypokalemia: Secondary | ICD-10-CM | POA: Insufficient documentation

## 2022-08-23 DIAGNOSIS — Z79899 Other long term (current) drug therapy: Secondary | ICD-10-CM | POA: Insufficient documentation

## 2022-08-23 DIAGNOSIS — U071 COVID-19: Secondary | ICD-10-CM | POA: Insufficient documentation

## 2022-08-23 DIAGNOSIS — E1110 Type 2 diabetes mellitus with ketoacidosis without coma: Secondary | ICD-10-CM | POA: Diagnosis not present

## 2022-08-23 DIAGNOSIS — R1110 Vomiting, unspecified: Secondary | ICD-10-CM | POA: Diagnosis present

## 2022-08-23 DIAGNOSIS — I10 Essential (primary) hypertension: Secondary | ICD-10-CM | POA: Diagnosis not present

## 2022-08-23 DIAGNOSIS — R739 Hyperglycemia, unspecified: Secondary | ICD-10-CM

## 2022-08-23 LAB — URINALYSIS, MICROSCOPIC (REFLEX): WBC, UA: NONE SEEN WBC/hpf (ref 0–5)

## 2022-08-23 LAB — URINALYSIS, ROUTINE W REFLEX MICROSCOPIC
Bilirubin Urine: NEGATIVE
Glucose, UA: >=500 mg/dL — AB
Hgb urine dipstick: NEGATIVE
Ketones, ur: NEGATIVE mg/dL
Leukocytes,Ua: NEGATIVE
Nitrite: NEGATIVE
Protein, ur: NEGATIVE mg/dL
Specific Gravity, Urine: 1.015 (ref 1.005–1.030)
pH: 6.5 (ref 5.0–8.0)

## 2022-08-23 LAB — COMPREHENSIVE METABOLIC PANEL
ALT: 22 U/L (ref 0–44)
AST: 20 U/L (ref 15–41)
Albumin: 3.7 g/dL (ref 3.5–5.0)
Alkaline Phosphatase: 116 U/L (ref 38–126)
Anion gap: 7 (ref 5–15)
BUN: 7 mg/dL (ref 6–20)
CO2: 26 mmol/L (ref 22–32)
Calcium: 8.1 mg/dL — ABNORMAL LOW (ref 8.9–10.3)
Chloride: 106 mmol/L (ref 98–111)
Creatinine, Ser: 0.63 mg/dL (ref 0.44–1.00)
GFR, Estimated: >60 mL/min (ref >60)
Glucose, Bld: 268 mg/dL — ABNORMAL HIGH (ref 70–99)
Potassium: 3.4 mmol/L — ABNORMAL LOW (ref 3.5–5.1)
Sodium: 139 mmol/L (ref 135–145)
Total Bilirubin: 0.3 mg/dL (ref 0.3–1.2)
Total Protein: 7.4 g/dL (ref 6.5–8.1)

## 2022-08-23 LAB — CBC
HCT: 36.7 % (ref 36.0–46.0)
Hemoglobin: 11.6 g/dL — ABNORMAL LOW (ref 12.0–15.0)
MCH: 23.7 pg — ABNORMAL LOW (ref 26.0–34.0)
MCHC: 31.6 g/dL (ref 30.0–36.0)
MCV: 75.1 fL — ABNORMAL LOW (ref 80.0–100.0)
Platelets: 205 10*3/uL (ref 150–400)
RBC: 4.89 MIL/uL (ref 3.87–5.11)
RDW: 15.8 % — ABNORMAL HIGH (ref 11.5–15.5)
WBC: 4.7 10*3/uL (ref 4.0–10.5)
nRBC: 0 % (ref 0.0–0.2)

## 2022-08-23 LAB — RESP PANEL BY RT-PCR (RSV, FLU A&B, COVID)  RVPGX2
Influenza A by PCR: NEGATIVE
Influenza B by PCR: NEGATIVE
Resp Syncytial Virus by PCR: NEGATIVE
SARS Coronavirus 2 by RT PCR: POSITIVE — AB

## 2022-08-23 LAB — CBG MONITORING, ED: Glucose-Capillary: 270 mg/dL — ABNORMAL HIGH (ref 70–99)

## 2022-08-23 LAB — LIPASE, BLOOD: Lipase: 42 U/L (ref 11–51)

## 2022-08-23 MED ORDER — ONDANSETRON HCL 4 MG PO TABS: 4.0000 mg | ORAL_TABLET | Freq: Three times a day (TID) | ORAL | 0 refills | Status: AC | PRN

## 2022-08-23 MED ORDER — POTASSIUM CHLORIDE CRYS ER 20 MEQ PO TBCR
40.0000 meq | EXTENDED_RELEASE_TABLET | Freq: Once | ORAL | Status: AC
  Administered 2022-08-23: 40 meq via ORAL
  Filled 2022-08-23: qty 2

## 2022-08-23 NOTE — ED Provider Notes (Signed)
[facility] Provider Note   CSN: [id number] Arrival date & time: 08/23/22  1514     History  Chief Complaint  Patient presents with   Vomiting    Jeanne Shepard is a 57 y.o. female with PMH HTN, T2DM, arthritis who presents to ED c/o generalized body aches, bilateral frontal headache, cough, congestion, nausea, and vomiting for the past 5 days. She reports she has been taking her daily medications as prescribed but her blood glucose has been in the 200s which also concerned her. She is currently in school to get her GED and wanted to know if she had anything contagious she may spread to her classmates. Suspected positive sick contacts. She denies chest pain, shortness of breath, fever, chills, abdominal pain, leg pain or swelling, or other complaints. Reports history of well-controlled asthma without frequent exacerbations and no recent wheezing or breathing difficulty. She has not received COVID-19 or influenza vaccinations.      Home Medications Prior to Admission medications   Medication Sig Start Date End Date Taking? Authorizing Provider  ondansetron (ZOFRAN) 4 MG tablet Take 1 tablet (4 mg total) by mouth every 8 (eight) hours as needed for up to 5 days for nausea or vomiting. 08/23/22 08/28/22 Yes Ramah Langhans L, PA-C  albuterol (VENTOLIN HFA) 108 (90 Base) MCG/ACT inhaler Inhale into the lungs.    [provider]  amLODipine (NORVASC) 10 MG tablet TK 1 T PO ONCE DAILY 09/21/17   [provider]  aspirin EC 81 MG tablet Take by mouth. 07/01/17   [provider]  Estradiol 10 MCG TABS vaginal tablet INSERT 1 TABLET INTRAVAGINALLY QHS FOR 2 WEEKS THEN TWICE WEEKLY 11/16/17   [provider]  fluticasone furoate-vilanterol (BREO ELLIPTA) 100-25 MCG/INH AEPB INHALE 1 PUFF INTO THE LUNGS DAILY 12/15/16   [provider]  gabapentin (NEURONTIN) 600 MG tablet  11/17/17   [provider]   lisinopril (PRINIVIL,ZESTRIL) 5 MG tablet TK 1 T PO ONCE DAILY 08/18/17   [provider]  metFORMIN (GLUCOPHAGE-XR) 500 MG 24 hr tablet TK 2 TS PO ATN 10/31/17   [provider]  methimazole (TAPAZOLE) 5 MG tablet  08/27/17   [provider]  methocarbamol (ROBAXIN) 500 MG tablet Take 1 tablet (500 mg total) by mouth every 8 (eight) hours as needed. 02/16/18   Dene Gentry, MD  omeprazole (PRILOSEC) 20 MG capsule TK 1 C PO BID 08/29/17   [provider]  pravastatin (PRAVACHOL) 40 MG tablet  10/14/17   [provider]  tiZANidine (ZANAFLEX) 4 MG tablet TAKE 1 TABLET(4 MG) BY MOUTH EVERY 8 HOURS AS NEEDED FOR MUSCLE SPASMS 06/27/18   Hudnall, Sharyn Lull, MD  Vitamin D, Ergocalciferol, (DRISDOL) 50000 units CAPS capsule Take by mouth. 03/31/17   [provider]  zolpidem (AMBIEN) 10 MG tablet TK 1 T PO Q NIGHT PRF SLP 10/26/17   [provider]      Allergies    Tramadol and Vicodin [hydrocodone-acetaminophen]    Review of Systems   Review of Systems  Constitutional:  Negative for activity change, appetite change, chills, diaphoresis and fever.  HENT:  Positive for congestion. Negative for ear pain, sinus pressure, sinus pain, sore throat, trouble swallowing and voice change.   Eyes:  Negative for photophobia, pain and visual disturbance.  Respiratory:  Positive for cough. Negative for apnea, choking, chest tightness, shortness of breath and wheezing.   Cardiovascular:  Negative for chest  pain, palpitations and leg swelling.  Gastrointestinal:  Positive for nausea and vomiting. Negative for abdominal pain and diarrhea.  Genitourinary:  Negative for decreased urine volume, difficulty urinating, dysuria, flank pain, frequency and hematuria.  Musculoskeletal:  Positive for myalgias. Negative for neck pain and neck stiffness.  Skin:  Negative for color change and rash.  Neurological:  Positive for headaches. Negative for dizziness,  seizures, syncope, speech difficulty, weakness, light-headedness and numbness.  Psychiatric/Behavioral:  Negative for confusion.   All other systems reviewed and are negative.   Physical Exam Updated Vital Signs BP 133/77   Pulse 91   Temp 98.3 F (36.8 C) (Oral)   Resp 18   Ht 5\' 1"  (1.549 m)   Wt 96.2 kg   SpO2 96%   BMI 40.06 kg/m  Physical Exam Vitals and nursing note reviewed.  Constitutional:      General: She is not in acute distress.    Appearance: Normal appearance. She is not ill-appearing or toxic-appearing.  HENT:     Head: Normocephalic and atraumatic.     Nose: Congestion (moderate bilateral) present.     Mouth/Throat:     Mouth: Mucous membranes are moist.     Pharynx: Oropharynx is clear. No oropharyngeal exudate or posterior oropharyngeal erythema.  Eyes:     General: No scleral icterus.       Right eye: No discharge.        Left eye: No discharge.     Conjunctiva/sclera: Conjunctivae normal.  Cardiovascular:     Rate and Rhythm: Normal rate and regular rhythm.     Heart sounds: No murmur heard. Pulmonary:     Effort: Pulmonary effort is normal. No respiratory distress.     Breath sounds: Normal breath sounds. No stridor. No wheezing, rhonchi or rales.  Chest:     Chest wall: No tenderness.  Abdominal:     General: Abdomen is flat. There is no distension.     Palpations: Abdomen is soft.     Tenderness: There is no abdominal tenderness. There is no right CVA tenderness, left CVA tenderness, guarding or rebound.  Musculoskeletal:        General: Normal range of motion.     Cervical back: Normal range of motion and neck supple. No rigidity.     Right lower leg: No edema.     Left lower leg: No edema.  Skin:    General: Skin is warm and dry.     Capillary Refill: Capillary refill takes less than 2 seconds.  Neurological:     General: No focal deficit present.     Mental Status: She is alert and oriented to person, place, and time.     Cranial  Nerves: No cranial nerve deficit.     Motor: No weakness.     Gait: Gait normal.  Psychiatric:        Mood and Affect: Mood normal.        Behavior: Behavior normal.     ED Results / Procedures / Treatments   Labs (all labs ordered are listed, but only abnormal results are displayed) Labs Reviewed  RESP PANEL BY RT-PCR (RSV, FLU A&B, COVID)  RVPGX2 - Abnormal; Notable for the following components:      Result Value   SARS Coronavirus 2 by RT PCR POSITIVE (*)    All other components within normal limits  COMPREHENSIVE METABOLIC PANEL - Abnormal; Notable for the following components:   Potassium 3.4 (*)    Glucose, Bld  268 (*)    Calcium 8.1 (*)    All other components within normal limits  CBC - Abnormal; Notable for the following components:   Hemoglobin 11.6 (*)    MCV 75.1 (*)    MCH 23.7 (*)    RDW 15.8 (*)    All other components within normal limits  URINALYSIS, ROUTINE W REFLEX MICROSCOPIC - Abnormal; Notable for the following components:   Glucose, UA >=500 (*)    All other components within normal limits  URINALYSIS, MICROSCOPIC (REFLEX) - Abnormal; Notable for the following components:   Bacteria, UA RARE (*)    All other components within normal limits  CBG MONITORING, ED - Abnormal; Notable for the following components:   Glucose-Capillary 270 (*)    All other components within normal limits  LIPASE, BLOOD    EKG None  Radiology No results found.  Procedures Procedures   Medications Ordered in ED Medications  potassium chloride SA (KLOR-CON M) CR tablet 40 mEq (40 mEq Oral Given 08/23/22 1733)    ED Course/ Medical Decision Making/ A&P                           Medical Decision Making Amount and/or Complexity of Data Reviewed Labs: ordered. Decision-making details documented in ED Course.  Risk Prescription drug management.   57 year old female presenting to ED with multiple complaints including upper respiratory symptoms and vomiting. Broad  differential including but not limited to upper respiratory infection, sinusitis, COVID-19, RSV, influenza, pneumonia, electrolyte disturbance, dehydration, gastroenteritis, acute abdomen, urinary tract infection. Exam unremarkable apart from congestion with LCTA, benign abdominal exam with no tenderness or peritoneal signs to suggest acute abdomen, moist mucus membranes and normal capillary refill, pt neurologically intact, mild tachycardia on initial vital signs but this has improved and pt is afebrile with otherwise unremarkable vital signs. High suspicion for viral illness and pt positive for COVID-19. Mild hypokalemia which was repleted. Baseline anemia with no reports of active bleeding. No UTI on UA and no urinary symptoms. Pt tolerated multiple PO trials in ED with no active nausea or vomiting and no anti-emetic required. She is overall well-appearing. Glucose is not well-controlled with numbers 268-270 but pt with stable vital signs, no ketonuria, and normal bicarb so do not suspect DKA. Discussed findings with pt including COVID-19 diagnosis, hypokalemia, and hyperglycemia. Pt currently exhibiting mild COVID-19 disease and is on day 5 of symptoms so not a good candidate for paxlovid. Discussed isolation precautions with her. Aware glucose may be elevated in setting of acute illness but reiterated importance of close outpatient primary care follow up for reassessment and better long-term glucose control. Strict ED return precautions given. Pt expressed understanding of treatment plan and return precautions. All questions answered and stable for discharge.    Final Clinical Impression(s) / ED Diagnoses Final diagnoses:  COVID  Hypokalemia  Hyperglycemia without ketosis    Rx / DC Orders ED Discharge Orders          Ordered    ondansetron (ZOFRAN) 4 MG tablet  Every 8 hours PRN        08/23/22 1729              Suzzette Righter, PA-C 08/25/22 1357    Dorie Rank, MD 08/27/22  1700

## 2022-08-23 NOTE — ED Triage Notes (Signed)
C/o bodyaches, headache, hyperglycemia, vomiting the past few days. Taking ozempic. States not tolerating PO.

## 2022-08-23 NOTE — Discharge Instructions (Addendum)
Viral Illness TREATMENT  Treatment is directed at relieving symptoms. There is no cure. Antibiotics are not effective because the infection is caused by a virus, not by bacteria. Treatment may include:  Increased fluid intake. Sports drinks offer valuable electrolytes, sugars, and fluids.  Breathing heated mist or steam (vaporizer or shower).  Eating chicken soup or other clear broths, and maintaining good nutrition.  Getting plenty of rest.  Using gargles or lozenges for comfort.  Increasing usage of your inhaler if you have asthma.  Return to work when your temperature has returned to normal.  Gargle warm salt water and spit it out for sore throat. Take benadryl to decrease sinus secretions. Continue to alternate between Tylenol and ibuprofen for pain and fever control.  Follow Up: Follow up with your primary care doctor in 5-7 days for recheck of ongoing symptoms.  Return to emergency department for emergent changing or worsening of symptoms.   Read the instructions below on reasons to return to the emergency department and to learn more about your diagnosis.  Use over the counter medications for symptomatic relief as we discussed (Mucinex as a decongestant, Tylenol for fever/pain, Motrin/Ibuprofen for muscle aches). Follow-up with your primary care doctor if your symptoms persist.  You are more than welcome to return to the emergency department if symptoms worsen or become concerning.  Upper Respiratory Infection, Adult  An upper respiratory infection (URI) is also sometimes known as the common cold. Most people improve within 1 week, but symptoms can last up to 2 weeks. A residual cough may last even longer.   URI is most commonly caused by a virus. Viruses are NOT treated with antibiotics. You can easily spread the virus to others by oral contact. This includes kissing, sharing a glass, coughing, or sneezing. Touching your mouth or nose and then touching a surface, which is then touched by  another person, can also spread the virus.

## 2022-11-11 ENCOUNTER — Encounter (HOSPITAL_BASED_OUTPATIENT_CLINIC_OR_DEPARTMENT_OTHER): Payer: Self-pay

## 2022-11-11 ENCOUNTER — Other Ambulatory Visit: Payer: Self-pay

## 2022-11-11 ENCOUNTER — Emergency Department (HOSPITAL_BASED_OUTPATIENT_CLINIC_OR_DEPARTMENT_OTHER)
Admission: EM | Admit: 2022-11-11 | Discharge: 2022-11-11 | Disposition: A | Discharge status: Home / Self Care | Payer: 59 | Attending: Emergency Medicine | Admitting: Emergency Medicine

## 2022-11-11 DIAGNOSIS — M542 Cervicalgia: Secondary | ICD-10-CM | POA: Diagnosis present

## 2022-11-11 DIAGNOSIS — R591 Generalized enlarged lymph nodes: Secondary | ICD-10-CM | POA: Insufficient documentation

## 2022-11-11 DIAGNOSIS — C50919 Malignant neoplasm of unspecified site of unspecified female breast: Secondary | ICD-10-CM | POA: Insufficient documentation

## 2022-11-11 DIAGNOSIS — Z7982 Long term (current) use of aspirin: Secondary | ICD-10-CM | POA: Diagnosis not present

## 2022-11-11 NOTE — ED Triage Notes (Signed)
States she found 2 knots on the back of her neck since yesterday, diagnosed with breast cancer recently, removed nodules. Concerned for further cancer.

## 2022-11-11 NOTE — ED Notes (Signed)
Discharge paperwork reviewed entirely with patient, including Rx's and follow up care. Pain was under control. Pt verbalized understanding as well as all parties involved. No questions or concerns voiced at the time of discharge. No acute distress noted.   Pt ambulated out to PVA without incident or assistance.  

## 2022-11-11 NOTE — Discharge Instructions (Signed)
Typically the lymph nodes that you have that are inflamed would be due to a problem with your scalp.  With your history of breast cancer that is concerning that perhaps they have been involved with your cancer.  Please discuss this with your oncologist and your surgeon.  They may want to see you in the office for repeat imaging.

## 2022-11-11 NOTE — ED Provider Notes (Signed)
[facility] Provider Note   CSN: [id number] Arrival date & time: 11/11/22  4163     History  Chief Complaint  Patient presents with   Neck Pain    Jeanne Shepard is a 57 y.o. female.  57 yo F with a chief complaints of swollen lymph nodes.  She tells me that she is currently being treated for breast cancer.  Recently had what sounds like a lumpectomy and lymph node dissection.  She was told that they did not get all the lymph nodes and would have to go back in.  Since then she has noticed that she had a couple of lumps at the base of her neck.  She did not notice these before.  The left 1 is bigger than the right 1.  No fevers no cough no congestion.  No ear pain.  No scalp lesions.   Neck Pain      Home Medications Prior to Admission medications   Medication Sig Start Date End Date Taking? Authorizing Provider  albuterol (VENTOLIN HFA) 108 (90 Base) MCG/ACT inhaler Inhale into the lungs.    [provider]  amLODipine (NORVASC) 10 MG tablet TK 1 T PO ONCE DAILY 09/21/17   [provider]  aspirin EC 81 MG tablet Take by mouth. 07/01/17   [provider]  Estradiol 10 MCG TABS vaginal tablet INSERT 1 TABLET INTRAVAGINALLY QHS FOR 2 WEEKS THEN TWICE WEEKLY 11/16/17   [provider]  fluticasone furoate-vilanterol (BREO ELLIPTA) 100-25 MCG/INH AEPB INHALE 1 PUFF INTO THE LUNGS DAILY 12/15/16   [provider]  gabapentin (NEURONTIN) 600 MG tablet  11/17/17   [provider]  lisinopril (PRINIVIL,ZESTRIL) 5 MG tablet TK 1 T PO ONCE DAILY 08/18/17   [provider]  metFORMIN (GLUCOPHAGE-XR) 500 MG 24 hr tablet TK 2 TS PO ATN 10/31/17   [provider]  methimazole (TAPAZOLE) 5 MG tablet  08/27/17   [provider]  methocarbamol (ROBAXIN) 500 MG tablet Take 1 tablet (500 mg total) by mouth every 8 (eight) hours as needed. 02/16/18   Lenda Kelp, MD  omeprazole  (PRILOSEC) 20 MG capsule TK 1 C PO BID 08/29/17   [provider]  pravastatin (PRAVACHOL) 40 MG tablet  10/14/17   [provider]  tiZANidine (ZANAFLEX) 4 MG tablet TAKE 1 TABLET(4 MG) BY MOUTH EVERY 8 HOURS AS NEEDED FOR MUSCLE SPASMS 06/27/18   Hudnall, Azucena Fallen, MD  Vitamin D, Ergocalciferol, (DRISDOL) 50000 units CAPS capsule Take by mouth. 03/31/17   [provider]  zolpidem (AMBIEN) 10 MG tablet TK 1 T PO Q NIGHT PRF SLP 10/26/17   [provider]      Allergies    Tramadol and Vicodin [hydrocodone-acetaminophen]    Review of Systems   Review of Systems  Musculoskeletal:  Positive for neck pain.    Physical Exam Updated Vital Signs BP 132/71 (BP Location: Right Arm)   Pulse 94   Temp 98.5 F (36.9 C) (Oral)   Resp 18   Ht 5\' 1"  (1.549 m)   Wt 97.1 kg   SpO2 98%   BMI 40.43 kg/m  Physical Exam Vitals and nursing note reviewed.  Constitutional:      General: She is not in acute distress.    Appearance: She is well-developed. She is not diaphoretic.  HENT:     Head: Normocephalic and atraumatic.     Comments: Patient has a hairpiece on.  No obvious tenderness with palpation of the scalp.    Mouth/Throat:     Comments: Patient has some posterior neck lymph nodes that are inflamed left greater than right.  Mildly tender.  Mobile. Eyes:     Pupils: Pupils are equal, round, and reactive to light.  Cardiovascular:     Rate and Rhythm: Normal rate and regular rhythm.     Heart sounds: No murmur heard.    No friction rub. No gallop.  Pulmonary:     Effort: Pulmonary effort is normal.     Breath sounds: No wheezing or rales.  Abdominal:     General: There is no distension.     Palpations: Abdomen is soft.     Tenderness: There is no abdominal tenderness.  Musculoskeletal:        General: No tenderness.     Cervical back: Normal range of motion and neck supple.  Skin:    General: Skin is warm and dry.  Neurological:     Mental  Status: She is alert and oriented to person, place, and time.  Psychiatric:        Behavior: Behavior normal.     ED Results / Procedures / Treatments   Labs (all labs ordered are listed, but only abnormal results are displayed) Labs Reviewed - No data to display  EKG None  Radiology No results found.  Procedures Procedures    Medications Ordered in ED Medications - No data to display  ED Course/ Medical Decision Making/ A&P                             Medical Decision Making  57 yo F with a chief complaints of occipital lymph node swelling and discomfort.  Noticed for a few days now.  She decided come to the hospital to be evaluated.  On record review the patient does have a history of breast cancer and recently had a lymph node dissection.  She is worried that maybe she has extension of cancer.  Is there tender I would suspect maybe they are more reactive, she denies any obvious infectious symptoms.  Does have a weave, but denies any scalp discomfort.  I am not sure that there is any emergent imaging through the ER setting that would benefit her.  I encouraged her to follow-up with her oncologist and general surgeon.  9:54 AM:  I have discussed the diagnosis/risks/treatment options with the patient.  Evaluation and diagnostic testing in the emergency department does not suggest an emergent condition requiring admission or immediate intervention beyond what has been performed at this time.  They will follow up with PCP. We also discussed returning to the ED immediately if new or worsening sx occur. We discussed the sx which are most concerning (e.g., sudden worsening pain, fever, inability to tolerate by mouth) that necessitate immediate return. Medications administered to the patient during their visit and any new prescriptions provided to the patient are listed below.  Medications given during this visit Medications - No data to display   The patient appears reasonably screen  and/or stabilized for discharge and I doubt any other medical condition or other [facility] requiring further screening, evaluation, or treatment in the ED at this time prior to discharge.          Final Clinical Impression(s) / ED Diagnoses Final diagnoses:  Lymphadenopathy    Rx / DC Orders ED Discharge Orders     None  Melene PlanFloyd, Juwuan Sedita, DO 11/11/22 [phone]
